# Patient Record
Sex: Female | Born: 1970 | Race: White | Hispanic: No | Marital: Married | Smoking: Current every day smoker
Health system: Southern US, Community
[De-identification: ages and names within clinical notes are randomized; demographics above are authoritative.]

## PROBLEM LIST (undated history)

## (undated) DIAGNOSIS — F411 Generalized anxiety disorder: Secondary | ICD-10-CM

## (undated) DIAGNOSIS — R112 Nausea with vomiting, unspecified: Secondary | ICD-10-CM

## (undated) DIAGNOSIS — R87619 Unspecified abnormal cytological findings in specimens from cervix uteri: Secondary | ICD-10-CM

## (undated) DIAGNOSIS — N809 Endometriosis, unspecified: Secondary | ICD-10-CM

## (undated) DIAGNOSIS — Z9889 Other specified postprocedural states: Secondary | ICD-10-CM

## (undated) DIAGNOSIS — Z800 Family history of malignant neoplasm of digestive organs: Secondary | ICD-10-CM

## (undated) HISTORY — PX: FRACTURE SURGERY: SHX138

## (undated) HISTORY — DX: Endometriosis, unspecified: N80.9

## (undated) HISTORY — DX: Unspecified abnormal cytological findings in specimens from cervix uteri: R87.619

## (undated) HISTORY — DX: Family history of malignant neoplasm of digestive organs: Z80.0

## (undated) HISTORY — PX: HEMICOLECTOMY: SHX854

## (undated) HISTORY — PX: ABDOMINAL HYSTERECTOMY: SHX81

## (undated) HISTORY — PX: ROBOTIC ASSISTED LAPAROSCOPIC HYSTERECTOMY AND SALPINGECTOMY: SHX6379

---

## 2000-02-10 DIAGNOSIS — C801 Malignant (primary) neoplasm, unspecified: Secondary | ICD-10-CM

## 2000-02-10 HISTORY — DX: Malignant (primary) neoplasm, unspecified: C80.1

## 2007-02-10 HISTORY — PX: AUGMENTATION MAMMAPLASTY: SUR837

## 2012-10-11 LAB — HM PAP SMEAR: HM Pap smear: NORMAL

## 2013-10-26 ENCOUNTER — Ambulatory Visit: Payer: Self-pay | Admitting: Gastroenterology

## 2013-11-07 ENCOUNTER — Ambulatory Visit: Payer: Self-pay | Admitting: Gastroenterology

## 2013-11-14 ENCOUNTER — Ambulatory Visit: Payer: Self-pay | Admitting: Urgent Care

## 2013-11-30 ENCOUNTER — Ambulatory Visit: Payer: Self-pay | Admitting: Surgery

## 2013-11-30 LAB — CBC WITH DIFFERENTIAL/PLATELET
BASOS PCT: 1.8 %
Basophil #: 0.1 10*3/uL (ref 0.0–0.1)
EOS PCT: 4.2 %
Eosinophil #: 0.3 10*3/uL (ref 0.0–0.7)
HCT: 41.5 % (ref 35.0–47.0)
HGB: 13.4 g/dL (ref 12.0–16.0)
LYMPHS ABS: 2.4 10*3/uL (ref 1.0–3.6)
Lymphocyte %: 38.8 %
MCH: 31.4 pg (ref 26.0–34.0)
MCHC: 32.2 g/dL (ref 32.0–36.0)
MCV: 98 fL (ref 80–100)
MONOS PCT: 6.8 %
Monocyte #: 0.4 x10 3/mm (ref 0.2–0.9)
Neutrophil #: 3 10*3/uL (ref 1.4–6.5)
Neutrophil %: 48.4 %
Platelet: 310 10*3/uL (ref 150–440)
RBC: 4.25 10*6/uL (ref 3.80–5.20)
RDW: 12.5 % (ref 11.5–14.5)
WBC: 6.3 10*3/uL (ref 3.6–11.0)

## 2013-11-30 LAB — BASIC METABOLIC PANEL
ANION GAP: 0 — AB (ref 7–16)
BUN: 10 mg/dL (ref 7–18)
CHLORIDE: 112 mmol/L — AB (ref 98–107)
Calcium, Total: 8.5 mg/dL (ref 8.5–10.1)
Co2: 27 mmol/L (ref 21–32)
Creatinine: 0.68 mg/dL (ref 0.60–1.30)
EGFR (Non-African Amer.): >60
Glucose: 62 mg/dL — ABNORMAL LOW (ref 65–99)
Osmolality: 275 (ref 275–301)
Potassium: 3.9 mmol/L (ref 3.5–5.1)
SODIUM: 139 mmol/L (ref 136–145)

## 2013-12-04 ENCOUNTER — Inpatient Hospital Stay: Payer: Self-pay | Admitting: Surgery

## 2013-12-05 LAB — COMPREHENSIVE METABOLIC PANEL
ALK PHOS: 42 U/L — AB
Albumin: 3 g/dL — ABNORMAL LOW (ref 3.4–5.0)
Anion Gap: 5 — ABNORMAL LOW (ref 7–16)
BILIRUBIN TOTAL: 0.5 mg/dL (ref 0.2–1.0)
BUN: 4 mg/dL — ABNORMAL LOW (ref 7–18)
CALCIUM: 7.8 mg/dL — AB (ref 8.5–10.1)
CHLORIDE: 106 mmol/L (ref 98–107)
Co2: 29 mmol/L (ref 21–32)
Creatinine: 0.67 mg/dL (ref 0.60–1.30)
GLUCOSE: 112 mg/dL — AB (ref 65–99)
Osmolality: 277 (ref 275–301)
POTASSIUM: 4.9 mmol/L (ref 3.5–5.1)
SGOT(AST): 23 U/L (ref 15–37)
SGPT (ALT): 16 U/L
SODIUM: 140 mmol/L (ref 136–145)
TOTAL PROTEIN: 5.7 g/dL — AB (ref 6.4–8.2)

## 2013-12-05 LAB — CBC WITH DIFFERENTIAL/PLATELET
BASOS PCT: 0.3 %
Basophil #: 0 10*3/uL (ref 0.0–0.1)
EOS ABS: 0 10*3/uL (ref 0.0–0.7)
Eosinophil %: 0.1 %
HCT: 36.8 % (ref 35.0–47.0)
HGB: 11.9 g/dL — AB (ref 12.0–16.0)
Lymphocyte #: 1.8 10*3/uL (ref 1.0–3.6)
Lymphocyte %: 11.6 %
MCH: 31.6 pg (ref 26.0–34.0)
MCHC: 32.2 g/dL (ref 32.0–36.0)
MCV: 98 fL (ref 80–100)
Monocyte #: 1 x10 3/mm — ABNORMAL HIGH (ref 0.2–0.9)
Monocyte %: 6.3 %
Neutrophil #: 12.6 10*3/uL — ABNORMAL HIGH (ref 1.4–6.5)
Neutrophil %: 81.7 %
Platelet: 287 10*3/uL (ref 150–440)
RBC: 3.75 10*6/uL — ABNORMAL LOW (ref 3.80–5.20)
RDW: 12.4 % (ref 11.5–14.5)
WBC: 15.4 10*3/uL — AB (ref 3.6–11.0)

## 2013-12-08 LAB — BASIC METABOLIC PANEL
ANION GAP: 9 (ref 7–16)
BUN: 5 mg/dL — ABNORMAL LOW (ref 7–18)
CO2: 32 mmol/L (ref 21–32)
CREATININE: 0.55 mg/dL — AB (ref 0.60–1.30)
Calcium, Total: 8.1 mg/dL — ABNORMAL LOW (ref 8.5–10.1)
Chloride: 98 mmol/L (ref 98–107)
EGFR (Non-African Amer.): >60
Glucose: 102 mg/dL — ABNORMAL HIGH (ref 65–99)
Osmolality: 275 (ref 275–301)
POTASSIUM: 3.3 mmol/L — AB (ref 3.5–5.1)
Sodium: 139 mmol/L (ref 136–145)

## 2013-12-08 LAB — CBC WITH DIFFERENTIAL/PLATELET
BASOS PCT: 0.6 %
Basophil #: 0.1 10*3/uL (ref 0.0–0.1)
Eosinophil #: 0.4 10*3/uL (ref 0.0–0.7)
Eosinophil %: 3 %
HCT: 41.6 % (ref 35.0–47.0)
HGB: 13.8 g/dL (ref 12.0–16.0)
LYMPHS ABS: 1.3 10*3/uL (ref 1.0–3.6)
Lymphocyte %: 10.1 %
MCH: 31.7 pg (ref 26.0–34.0)
MCHC: 33.3 g/dL (ref 32.0–36.0)
MCV: 95 fL (ref 80–100)
MONO ABS: 0.9 x10 3/mm (ref 0.2–0.9)
Monocyte %: 7 %
Neutrophil #: 9.9 10*3/uL — ABNORMAL HIGH (ref 1.4–6.5)
Neutrophil %: 79.3 %
PLATELETS: 361 10*3/uL (ref 150–440)
RBC: 4.37 10*6/uL (ref 3.80–5.20)
RDW: 12.2 % (ref 11.5–14.5)
WBC: 12.5 10*3/uL — ABNORMAL HIGH (ref 3.6–11.0)

## 2013-12-08 LAB — CLOSTRIDIUM DIFFICILE(ARMC)

## 2014-02-21 ENCOUNTER — Ambulatory Visit: Payer: Self-pay | Admitting: Obstetrics and Gynecology

## 2014-02-21 LAB — CBC
HCT: 40.8 % (ref 35.0–47.0)
HGB: 13.3 g/dL (ref 12.0–16.0)
MCH: 31.6 pg (ref 26.0–34.0)
MCHC: 32.6 g/dL (ref 32.0–36.0)
MCV: 97 fL (ref 80–100)
Platelet: 346 10*3/uL (ref 150–440)
RBC: 4.21 10*6/uL (ref 3.80–5.20)
RDW: 13.2 % (ref 11.5–14.5)
WBC: 8.4 10*3/uL (ref 3.6–11.0)

## 2014-02-21 LAB — COMPREHENSIVE METABOLIC PANEL
ALBUMIN: 3.3 g/dL — AB (ref 3.4–5.0)
ANION GAP: 5 — AB (ref 7–16)
Alkaline Phosphatase: 44 U/L — ABNORMAL LOW
BUN: 8 mg/dL (ref 7–18)
Bilirubin,Total: 0.4 mg/dL (ref 0.2–1.0)
CHLORIDE: 107 mmol/L (ref 98–107)
Calcium, Total: 8.5 mg/dL (ref 8.5–10.1)
Co2: 27 mmol/L (ref 21–32)
Creatinine: 0.6 mg/dL (ref 0.60–1.30)
EGFR (African American): >60
EGFR (Non-African Amer.): >60
Glucose: 78 mg/dL (ref 65–99)
OSMOLALITY: 275 (ref 275–301)
POTASSIUM: 4.1 mmol/L (ref 3.5–5.1)
SGOT(AST): 20 U/L (ref 15–37)
SGPT (ALT): 16 U/L
SODIUM: 139 mmol/L (ref 136–145)
TOTAL PROTEIN: 6.3 g/dL — AB (ref 6.4–8.2)

## 2014-02-27 ENCOUNTER — Ambulatory Visit: Payer: Self-pay | Admitting: Obstetrics and Gynecology

## 2014-02-28 LAB — BASIC METABOLIC PANEL
ANION GAP: 5 — AB (ref 7–16)
BUN: 4 mg/dL — AB (ref 7–18)
Calcium, Total: 7.5 mg/dL — ABNORMAL LOW (ref 8.5–10.1)
Chloride: 109 mmol/L — ABNORMAL HIGH (ref 98–107)
Co2: 25 mmol/L (ref 21–32)
Creatinine: 0.66 mg/dL (ref 0.60–1.30)
GLUCOSE: 102 mg/dL — AB (ref 65–99)
OSMOLALITY: 275 (ref 275–301)
Potassium: 3.6 mmol/L (ref 3.5–5.1)
Sodium: 139 mmol/L (ref 136–145)

## 2014-02-28 LAB — CBC
HCT: 33.7 % — ABNORMAL LOW (ref 35.0–47.0)
HGB: 11.2 g/dL — ABNORMAL LOW (ref 12.0–16.0)
MCH: 32.2 pg (ref 26.0–34.0)
MCHC: 33.4 g/dL (ref 32.0–36.0)
MCV: 97 fL (ref 80–100)
PLATELETS: 264 10*3/uL (ref 150–440)
RBC: 3.49 10*6/uL — ABNORMAL LOW (ref 3.80–5.20)
RDW: 13.1 % (ref 11.5–14.5)
WBC: 9.9 10*3/uL (ref 3.6–11.0)

## 2014-02-28 LAB — MAGNESIUM: Magnesium: 1.6 mg/dL — ABNORMAL LOW

## 2014-06-02 NOTE — Discharge Summary (Signed)
PATIENT NAME:  [name], [name] MR#:  [id number] DATE OF BIRTH:  1970-03-10  DATE OF ADMISSION:  12/04/2013 DATE OF DISCHARGE:  12/10/2013  DISCHARGE DIAGNOSIS: Right appendiceal/terminal ileal cecal mass consistent with diverticulosis, status post laparoscopic-assisted right hemicolectomy.   DISCHARGE MEDICATIONS ARE AS FOLLOWS:  1. Norco 1 to 2 tablets p.o. q.4 h. p.r.n. pain.  2. Zofran 4 mg p.o. q.8 h. p.r.n. nausea.   INDICATION FOR ADMISSION: [name] is a pleasant, 44 year old, who was noted to have a large external mass on her terminal ileum/appendiceal/cecal area concerning for neoplasm, including gist or carcinoid. She was thus recommended to have a surgical resection of this mass for pathology and possible therapeutic reasons.   HOSPITAL COURSE AS FOLLOWS: [name] underwent unremarkable surgery. Throughout the hospital course, she was advanced from clear liquids to a regular diet. She was also advanced from IV to p.o. pain medications at the time of discharge, taking good p.o. with good p.o. pain control. Was voiding and stooling without difficulty.   DISCHARGE INSTRUCTIONS AS FOLLOWS: [name] is to call or return to the ED if she has increased pain, nausea, vomiting, redness or drainage from her incision. She is to follow up in approximately 1 week with [facility] for follow-up care.    ____________________________ Glena Norfolk. Evaline Waltman, MD cal:JT D: 12/20/2013 08:44:44 ET T: 12/20/2013 09:26:06 ET JOB#: [id number]  cc: Harrell Gave A. Avonda Toso, MD, <Dictator> Floyde Parkins MD ELECTRONICALLY SIGNED 12/24/2013 19:32

## 2014-06-02 NOTE — Op Note (Signed)
PATIENT NAME:  [name], [name] MR#:  [id number] DATE OF BIRTH:  12/08/1970  DATE OF PROCEDURE:  12/04/2013   ATTENDING PHYSICIAN:  Louis Matte, MD DATE OF BIRTH: [name]. Aubrionna Istre, MD   PREOPERATIVE DIAGNOSIS: Cecal mass.   POSTOPERATIVE DIAGNOSIS:  Cecal mass.  PROCEDURE PERFORMED: Laparoscopic right hemicolectomy.   ANESTHESIA: General.   ESTIMATED BLOOD LOSS: 25 mL.   COMPLICATIONS: None.   SPECIMEN: Right colon.   INDICATION FOR SURGERY: [name] is a pleasant 44 year old who was noted to have a pericecal mass on CT scan. She was brought to the operating room for concern for malignancy.   DETAILS OF PROCEDURE: Informed consent was obtained. [name] was brought to the operating room suite. She was induced, endotracheal tube was placed, general anesthesia was administered. Her abdomen was prepped and draped in standard surgical fashion. A timeout was then performed correctly identifying the patient name, operative site, and procedure to be performed. An infraumbilical incision was made, it was deepened down to the fascia. The fascia was incised. The peritoneum was entered. Two stay sutures were placed at the fasciotomy. A Hassan trocar was placed in the abdomen. The abdomen was insufflated. Left lower quadrant and suprapubic 5 mm trocars were placed. The appendix was entered. The cecum was then grabbed.  There appeared to be a mass at potentially the appendiceal orifice. It just looked unusual. I then used the Harmonic scalpel to mobilize the right colon to the mid transverse colon. After I was satisfied with mobilization, a small midline incision was made. This was deepened down to the fascia. The fascia was incised.  The abdomen was entered. The specimen was then brought onto the field. There was a significant nodule that could be palpated in the appendix as well as multiple satellite nodules; 1 on the mesentery of the small bowel which upstream from the cecum. I chose the point  approximately 5 cm away from all gross disease proximally and to the right of the middle colic and then transected the bowel using a GIA 75 stapler. A combination of Harmonic scalpel and suture ligatures was used to high ligate the mesentery to this piece of bowel.  The specimen was then sent to pathology.  I then performed anastomosis between the terminal ileum and the transverse colon using a side-to-side functional end-to-end anastomotic strategy.  An enterotomy and colotomy were made.  A GIA 75 was used to make a common channel and then the colotomy and enterotomy were closed with a TA 60 stapler. I then irrigated the abdomen, examined for hemostasis. Hemostasis was obtained. I then returned to the bowel into the abdomen and closed the fascia with a running looped #1 PDS. Staples were then used to complete the dressing. The patient was then awoken, extubated and brought to the postanesthesia care unit. There were no immediate complications. Needle, sponge, and instrument counts were correct at the end of the procedure.    ____________________________ [name]. Miraj Truss, MD cal:jp D: 12/06/2013 07:30:08 ET T: 12/06/2013 08:39:52 ET JOB#: [id number]  cc: Harrell Gave A. Aunesty Tyson, MD, <Dictator> Floyde Parkins MD ELECTRONICALLY SIGNED 12/12/2013 7:06

## 2014-06-04 LAB — SURGICAL PATHOLOGY
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
## 2014-08-16 ENCOUNTER — Emergency Department
Admission: EM | Admit: 2014-08-16 | Discharge: 2014-08-16 | Disposition: A | Discharge status: Home / Self Care | Payer: TRICARE For Life (TFL) | Attending: Emergency Medicine | Admitting: Emergency Medicine

## 2014-08-16 ENCOUNTER — Encounter: Payer: Self-pay | Admitting: Medical Oncology

## 2014-08-16 DIAGNOSIS — R109 Unspecified abdominal pain: Secondary | ICD-10-CM | POA: Diagnosis present

## 2014-08-16 DIAGNOSIS — R300 Dysuria: Secondary | ICD-10-CM

## 2014-08-16 DIAGNOSIS — N390 Urinary tract infection, site not specified: Secondary | ICD-10-CM | POA: Diagnosis not present

## 2014-08-16 DIAGNOSIS — Z720 Tobacco use: Secondary | ICD-10-CM | POA: Diagnosis not present

## 2014-08-16 LAB — COMPREHENSIVE METABOLIC PANEL
ALT: 11 U/L — ABNORMAL LOW (ref 14–54)
AST: 15 U/L (ref 15–41)
Albumin: 3.9 g/dL (ref 3.5–5.0)
Alkaline Phosphatase: 56 U/L (ref 38–126)
Anion gap: 6 (ref 5–15)
BILIRUBIN TOTAL: 0.2 mg/dL — AB (ref 0.3–1.2)
BUN: 8 mg/dL (ref 6–20)
CALCIUM: 8.8 mg/dL — AB (ref 8.9–10.3)
CO2: 26 mmol/L (ref 22–32)
Chloride: 106 mmol/L (ref 101–111)
Creatinine, Ser: 0.77 mg/dL (ref 0.44–1.00)
GFR calc Af Amer: >60 mL/min (ref >60)
GFR calc non Af Amer: >60 mL/min (ref >60)
GLUCOSE: 100 mg/dL — AB (ref 65–99)
Potassium: 4.4 mmol/L (ref 3.5–5.1)
SODIUM: 138 mmol/L (ref 135–145)
TOTAL PROTEIN: 6.7 g/dL (ref 6.5–8.1)

## 2014-08-16 LAB — URINALYSIS COMPLETE WITH MICROSCOPIC (ARMC ONLY)
BACTERIA UA: NONE SEEN
Bilirubin Urine: NEGATIVE
Glucose, UA: NEGATIVE mg/dL
KETONES UR: NEGATIVE mg/dL
Nitrite: NEGATIVE
Protein, ur: 100 mg/dL — AB
SPECIFIC GRAVITY, URINE: 1.019 (ref 1.005–1.030)
Trans Epithel, UA: 1
pH: 5 (ref 5.0–8.0)

## 2014-08-16 LAB — CBC WITH DIFFERENTIAL/PLATELET
Basophils Absolute: 0 10*3/uL (ref 0–0.1)
Basophils Relative: 1 %
Eosinophils Absolute: 0.3 10*3/uL (ref 0–0.7)
Eosinophils Relative: 4 %
HCT: 40.3 % (ref 35.0–47.0)
Hemoglobin: 13.6 g/dL (ref 12.0–16.0)
LYMPHS ABS: 2.1 10*3/uL (ref 1.0–3.6)
Lymphocytes Relative: 23 %
MCH: 31.5 pg (ref 26.0–34.0)
MCHC: 33.7 g/dL (ref 32.0–36.0)
MCV: 93.7 fL (ref 80.0–100.0)
Monocytes Absolute: 0.6 10*3/uL (ref 0.2–0.9)
Monocytes Relative: 7 %
NEUTROS PCT: 65 %
Neutro Abs: 6 10*3/uL (ref 1.4–6.5)
PLATELETS: 295 10*3/uL (ref 150–440)
RBC: 4.3 MIL/uL (ref 3.80–5.20)
RDW: 12.7 % (ref 11.5–14.5)
WBC: 9 10*3/uL (ref 3.6–11.0)

## 2014-08-16 MED ORDER — CIPROFLOXACIN HCL 500 MG PO TABS: 500.0000 mg | ORAL_TABLET | Freq: Once | ORAL | Status: DC

## 2014-08-16 MED ORDER — CIPROFLOXACIN HCL 500 MG PO TABS
ORAL_TABLET | ORAL | Status: AC
  Administered 2014-08-16: 500 mg via ORAL
  Filled 2014-08-16: qty 1

## 2014-08-16 MED ORDER — CIPROFLOXACIN HCL 500 MG PO TABS
500.0000 mg | ORAL_TABLET | Freq: Once | ORAL | Status: AC
  Administered 2014-08-16: 500 mg via ORAL

## 2014-08-16 NOTE — Discharge Instructions (Signed)
Your urine showed red blood cells and white blood cells too numerous to count and leukocyte esterase present.  Take Cipro for this urinary tract infection. Follow-up with your primary doctor for further concerns and ongoing care.  Turn to the emergency department if you have fever, increasing pain, or other urgent concerns.  Urinary Tract Infection Urinary tract infections (UTIs) can develop anywhere along your urinary tract. Your urinary tract is your body's drainage system for removing wastes and extra water. Your urinary tract includes two kidneys, two ureters, a bladder, and a urethra. Your kidneys are a pair of bean-shaped organs. Each kidney is about the size of your fist. They are located below your ribs, one on each side of your spine. CAUSES Infections are caused by microbes, which are microscopic organisms, including fungi, viruses, and bacteria. These organisms are so small that they can only be seen through a microscope. Bacteria are the microbes that most commonly cause UTIs. SYMPTOMS  Symptoms of UTIs may vary by age and gender of the patient and by the location of the infection. Symptoms in young women typically include a frequent and intense urge to urinate and a painful, burning feeling in the bladder or urethra during urination. Older women and men are more likely to be tired, shaky, and weak and have muscle aches and abdominal pain. A fever may mean the infection is in your kidneys. Other symptoms of a kidney infection include pain in your back or sides below the ribs, nausea, and vomiting. DIAGNOSIS To diagnose a UTI, your caregiver will ask you about your symptoms. Your caregiver also will ask to provide a urine sample. The urine sample will be tested for bacteria and white blood cells. White blood cells are made by your body to help fight infection. TREATMENT  Typically, UTIs can be treated with medication. Because most UTIs are caused by a bacterial infection, they usually can  be treated with the use of antibiotics. The choice of antibiotic and length of treatment depend on your symptoms and the type of bacteria causing your infection. HOME CARE INSTRUCTIONS  If you were prescribed antibiotics, take them exactly as your caregiver instructs you. Finish the medication even if you feel better after you have only taken some of the medication.  Drink enough water and fluids to keep your urine clear or pale yellow.  Avoid caffeine, tea, and carbonated beverages. They tend to irritate your bladder.  Empty your bladder often. Avoid holding urine for long periods of time.  Empty your bladder before and after sexual intercourse.  After a bowel movement, women should cleanse from front to back. Use each tissue only once. SEEK MEDICAL CARE IF:   You have back pain.  You develop a fever.  Your symptoms do not begin to resolve within 3 days. SEEK IMMEDIATE MEDICAL CARE IF:   You have severe back pain or lower abdominal pain.  You develop chills.  You have nausea or vomiting.  You have continued burning or discomfort with urination. MAKE SURE YOU:   Understand these instructions.  Will watch your condition.  Will get help right away if you are not doing well or get worse. Document Released: 11/05/2004 Document Revised: 07/28/2011 Document Reviewed: 03/06/2011 [facility] Patient Information 2015 [place], [place]. This information is not intended to replace advice given to you by your health care provider. Make sure you discuss any questions you have with your health care provider.

## 2014-08-16 NOTE — ED Notes (Signed)
Pt informed to return if any life threatening symptoms occur.  

## 2014-08-16 NOTE — ED Provider Notes (Signed)
[facility] Emergency Department Provider Note  ____________________________________________  Time seen: 1810  I have reviewed the triage vital signs and the nursing notes.   HISTORY  Chief Complaint Abdominal Pain Dysuria Diarrhea    HPI [name] is a 44 y.o. female who began have diarrhea this past Monday. The diarrhea continues but is improving. She is here primarily because she has dysuria. She has a sense of urgency and burning. She is feeling like she needs to go. Return to 15 minutes. She started taking Azo 2 days ago. This did not help. She saw her primary care doctor yesterday and he obtained a urine sample for culture. Due to the fact that she was taking Azo, they did not do a dip in the office. He did not prescribe antibiotics for her because she has a complicated gastrointestinal history with a hemicolectomy earlier this year. The patient is not complaining of any belly pain. As mentioned above, her diarrhea is improving.   History reviewed. No pertinent past medical history.  There are no active problems to display for this patient.   Past Surgical History  Procedure Laterality Date  . Abdominal hysterectomy    . Hemicolectomy    . Fracture surgery      Current Outpatient Rx  Name  Route  Sig  Dispense  Refill  . ciprofloxacin (CIPRO) 500 MG tablet   Oral   Take 1 tablet (500 mg total) by mouth once.   14 tablet   0     Allergies Review of patient's allergies indicates no known allergies.  No family history on file.  Social History History  Substance Use Topics  . Smoking status: Current Every Day Smoker -- 1.00 packs/day  . Smokeless tobacco: Not on file  . Alcohol Use: Yes     Comment: weekends    Review of Systems  Constitutional: Negative for fever. ENT: Negative for sore throat. Cardiovascular: Negative for chest pain. Respiratory: Negative for shortness of breath. Gastrointestinal: Positive for diarrhea. See  history of present illness. Genitourinary: Positive for dysuria, urgency. See history of present illness Musculoskeletal: No myalgias or injuries. Skin: Negative for rash. Neurological: Negative for headaches   10-point ROS otherwise negative.  ____________________________________________   PHYSICAL EXAM:  VITAL SIGNS: ED Triage Vitals  Enc Vitals Group     BP 08/16/14 1710 126/72 mmHg     Pulse Rate 08/16/14 1710 96     Resp 08/16/14 1710 18     Temp 08/16/14 1710 98.2 F (36.8 C)     Temp Source 08/16/14 1710 Oral     SpO2 08/16/14 1710 99 %     Weight 08/16/14 1710 132 lb (59.875 kg)     Height 08/16/14 1710 5\' 4"  (1.626 m)     Head Cir --      Peak Flow --      Pain Score 08/16/14 1711 10     Pain Loc --      Pain Edu? --      Excl. in [place]? --     Constitutional: Alert and oriented. Well appearing and in no distress. ENT   Head: Normocephalic and atraumatic.   Nose: No congestion/rhinnorhea. Cardiovascular: Normal rate, regular rhythm, no murmur noted Respiratory:  Normal respiratory effort, no tachypnea.    Breath sounds are clear and equal bilaterally.  Gastrointestinal: Soft and nontender. No distention.  Back: No muscle spasm, no tenderness, no CVA tenderness. Musculoskeletal: No deformity noted. Nontender with normal range of motion  in all extremities.  No noted edema. Psychiatric: Mood and affect are normal. Speech and behavior are normal.  ____________________________________________    LABS (pertinent positives/negatives)  Labs Reviewed  COMPREHENSIVE METABOLIC PANEL - Abnormal; Notable for the following:    Glucose, Bld 100 (*)    Calcium 8.8 (*)    ALT 11 (*)    Total Bilirubin 0.2 (*)    All other components within normal limits  URINALYSIS COMPLETEWITH MICROSCOPIC ([facility] ONLY) - Abnormal; Notable for the following:    Color, Urine YELLOW (*)    APPearance CLOUDY (*)    Hgb urine dipstick 3+ (*)    Protein, ur 100 (*)    Leukocytes, UA  2+ (*)    Squamous Epithelial / LPF 0-5 (*)    All other components within normal limits  CBC WITH DIFFERENTIAL/PLATELET     ____________________________________________   EKG    ____________________________________________    RADIOLOGY    ____________________________________________   PROCEDURES    ____________________________________________   INITIAL IMPRESSION / ASSESSMENT AND PLAN / ED COURSE  Pertinent labs & imaging results that were available during my care of the patient were reviewed by me and considered in my medical decision making (see chart for details).   44 year old female in no acute distress but with complaint of dysuria and urinary urgency. Her urinalysis today shows red blood cells too numerous to count and white blood cells too numerous to count with 2+ leukocyte esterase. Her serum white blood cell count is normal at 9. We will prescribe ciprofloxacin for this urinary tract infection in part due to the double coverage that may be obtained with her diarrhea.  The patient has a culture pending from her primary physician. I will ask her to follow-up with her primary physician for ongoing care.  ____________________________________________   FINAL CLINICAL IMPRESSION(S) / ED DIAGNOSES  Final diagnoses:  Dysuria  UTI (lower urinary tract infection)      Ahmed Prima, MD 08/16/14 [phone]

## 2014-08-16 NOTE — ED Notes (Signed)
Pt ambulatory to triage with reports that she has been having lower abd pressure with frequent urination since Monday, also reports diarrhea.

## 2015-04-19 ENCOUNTER — Other Ambulatory Visit: Payer: Self-pay | Admitting: Obstetrics and Gynecology

## 2015-04-19 ENCOUNTER — Inpatient Hospital Stay
Admission: RE | Admit: 2015-04-19 | Discharge: 2015-04-19 | Disposition: A | Discharge status: Home / Self Care | Payer: Self-pay | Source: Ambulatory Visit | Attending: *Deleted | Admitting: *Deleted

## 2015-04-19 ENCOUNTER — Other Ambulatory Visit: Payer: Self-pay | Admitting: *Deleted

## 2015-04-19 DIAGNOSIS — N63 Unspecified lump in unspecified breast: Secondary | ICD-10-CM

## 2015-04-19 DIAGNOSIS — Z9289 Personal history of other medical treatment: Secondary | ICD-10-CM

## 2015-04-22 ENCOUNTER — Ambulatory Visit
Admission: RE | Admit: 2015-04-22 | Discharge: 2015-04-22 | Disposition: A | Discharge status: Home / Self Care | Source: Ambulatory Visit | Attending: Obstetrics and Gynecology | Admitting: Obstetrics and Gynecology

## 2015-04-22 DIAGNOSIS — N63 Unspecified lump in unspecified breast: Secondary | ICD-10-CM

## 2015-05-06 ENCOUNTER — Other Ambulatory Visit: Payer: TRICARE For Life (TFL)

## 2015-05-06 ENCOUNTER — Ambulatory Visit: Payer: TRICARE For Life (TFL)

## 2016-01-21 IMAGING — CT CT ABD-PELV W/ CM
2 of 5 series · 16 of 46 positions shown, 18 images · IV contrast (isovue)
Comparison: Colonoscopy report of 10/26/2013.

CLINICAL DATA: Followup of abnormal colonoscopy. Family history of
colon cancer. Colonoscopy demonstrating possible inverted appendix
versus extrinsic compression. Status post biopsies.

EXAM:
CT ABDOMEN AND PELVIS WITH CONTRAST
TECHNIQUE: Multidetector CT imaging of the abdomen and pelvis was performed
using the standard protocol following bolus administration of
intravenous contrast.
CONTRAST:  100 cc Isovue 370

[Series 2: routine with · axial · 0.66mm/px · z∈[-1141,-766]mm · 13 of 87 slices shown, 15 images]
[im 6/87  soft-tissue]
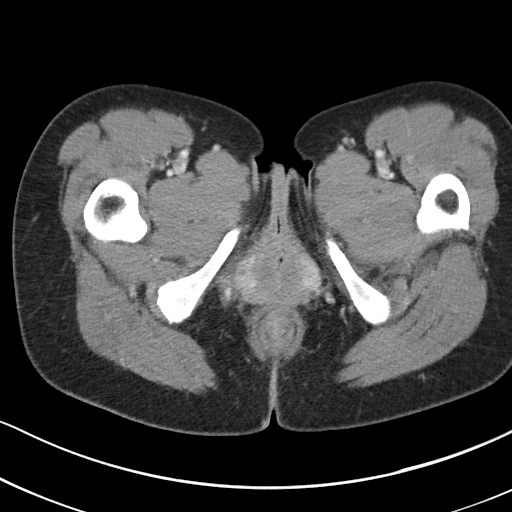
[im 6/87  bone]
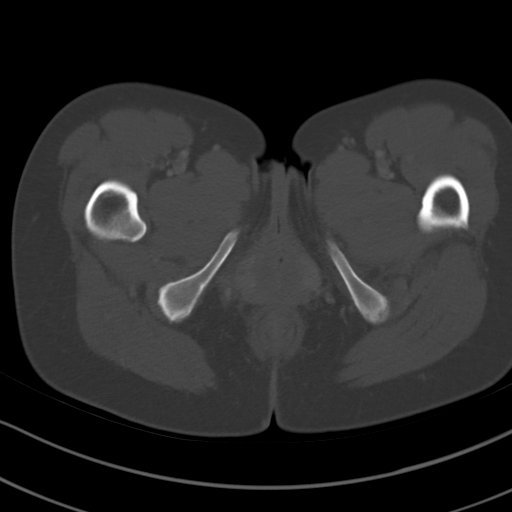
[im 11/87  soft-tissue]
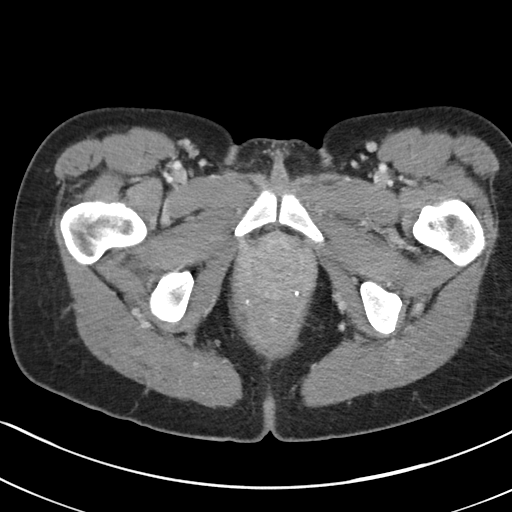
[im 21/87  soft-tissue]
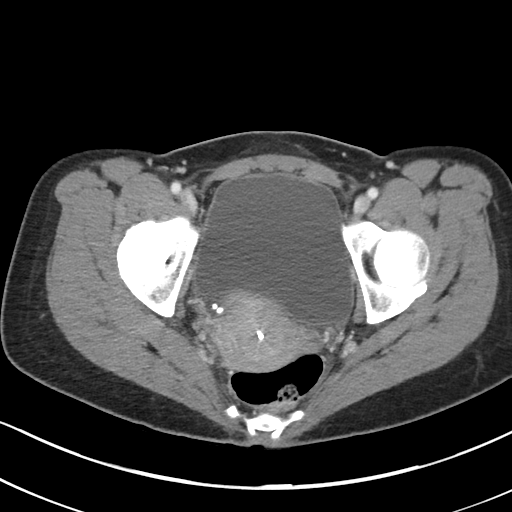
[im 26/87  soft-tissue]
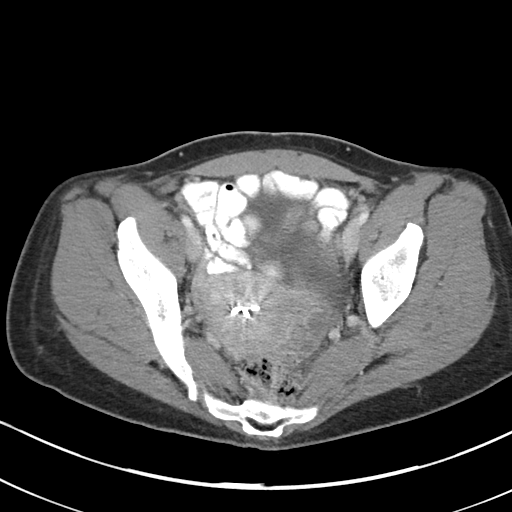
[im 31/87  soft-tissue]
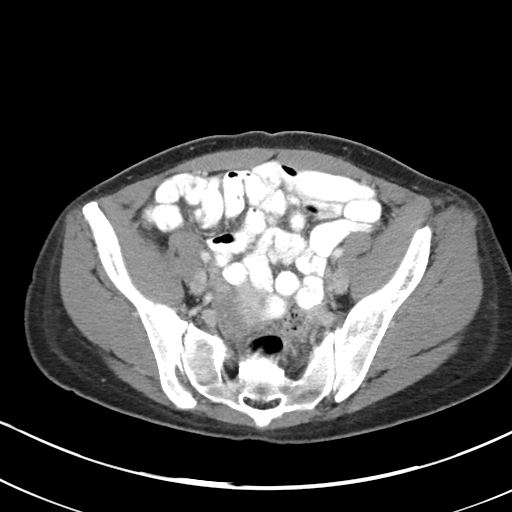
[im 36/87  soft-tissue]
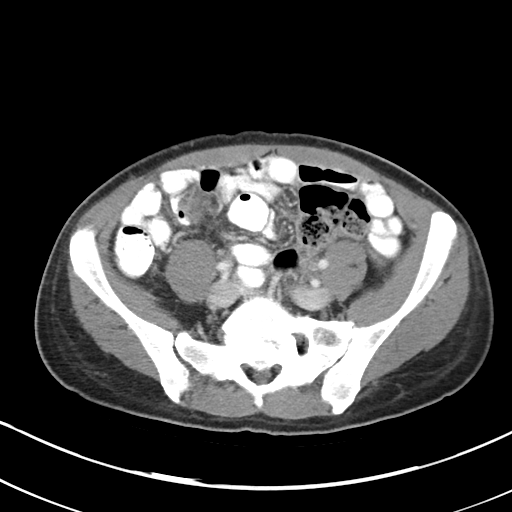
[im 46/87  soft-tissue]
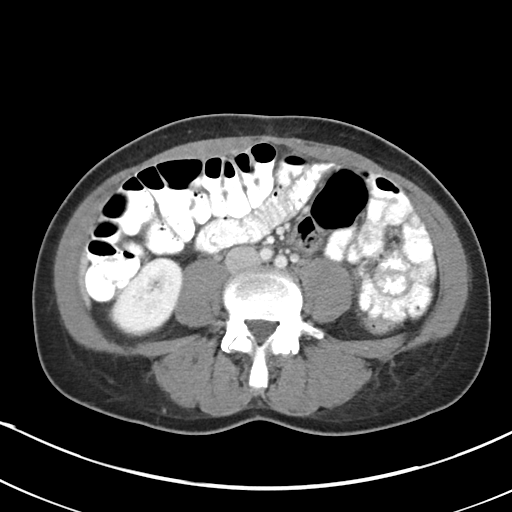
[im 51/87  soft-tissue]
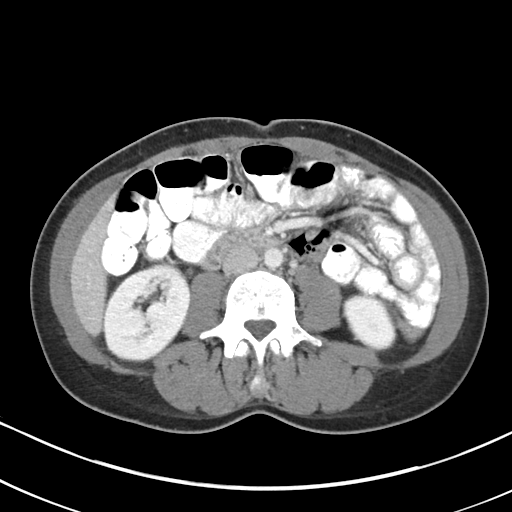
[im 56/87  soft-tissue]
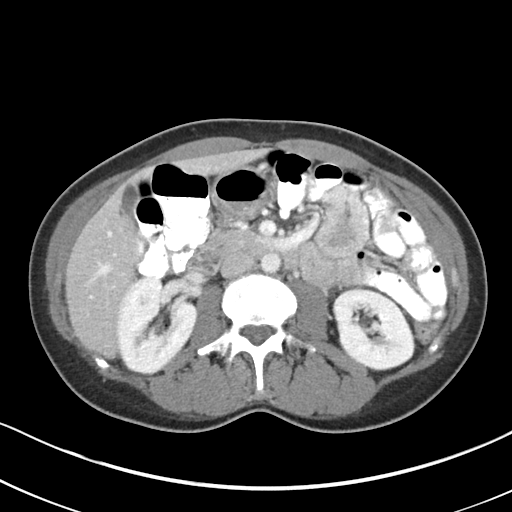
[im 56/87  bone]
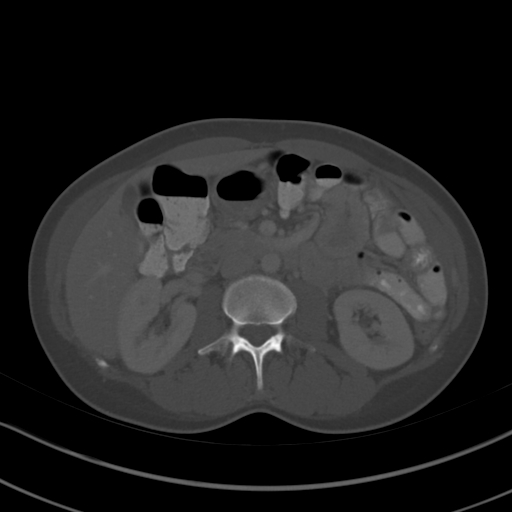
[im 61/87  soft-tissue]
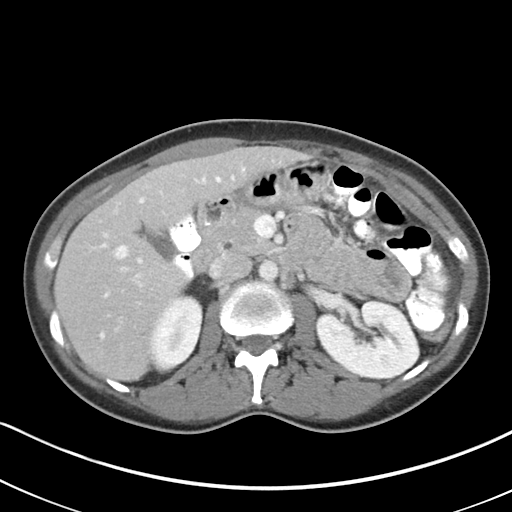
[im 66/87  soft-tissue]
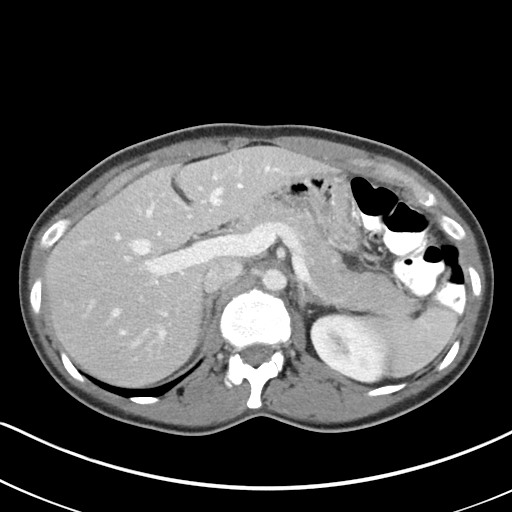
[im 76/87  soft-tissue]
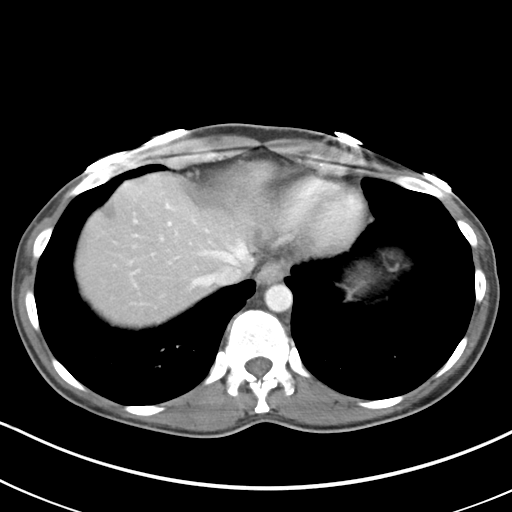
[im 81/87  soft-tissue]
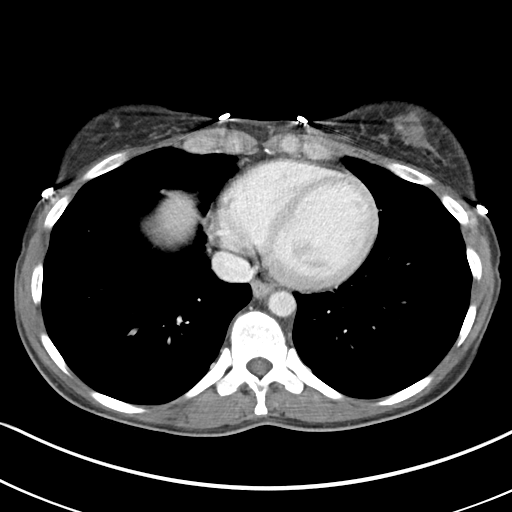

[Series 6: cor routine with · coronal · 0.77mm/px · 3 of 122 slices shown]
[im 41/122  soft-tissue]
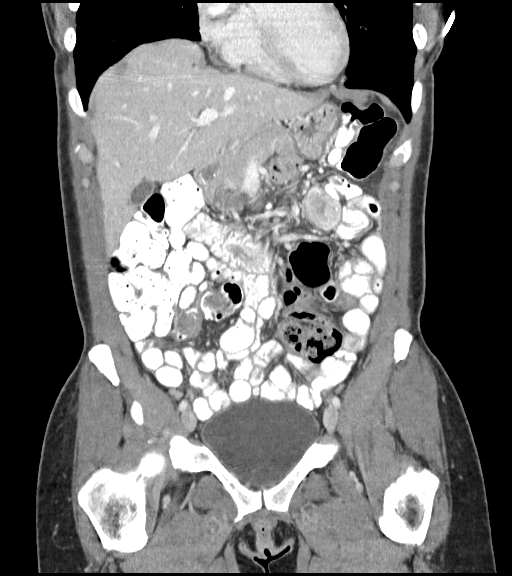
[im 54/122  soft-tissue]
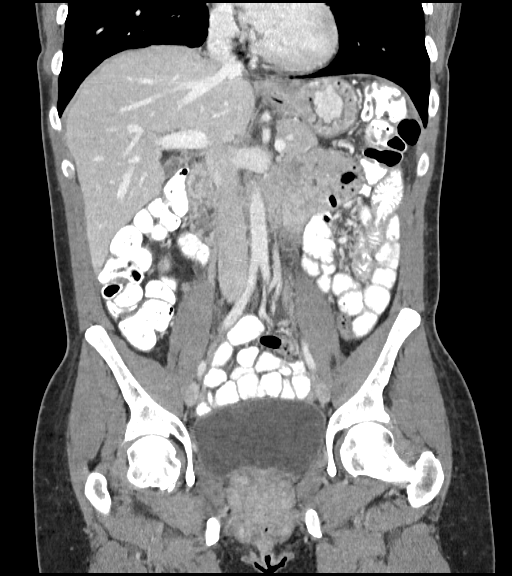
[im 68/122  soft-tissue]
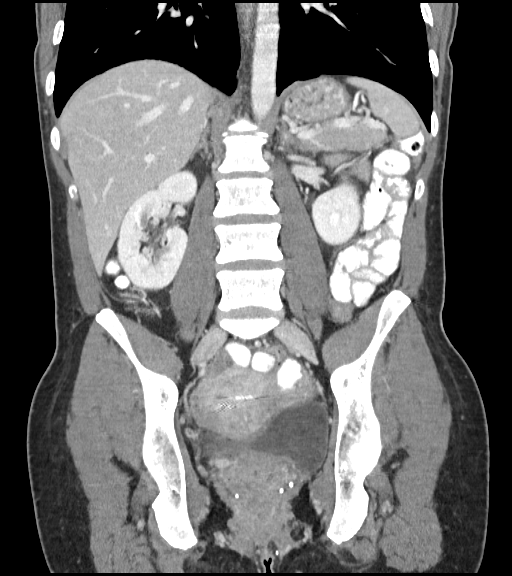

[16 of 46 positions shown; findings below may reference images not displayed]

FINDINGS: Lower chest: 3 mm left lower lobe lung nodule on image 10.

Mild scarring in the left lower lobe laterally. 2 mm low subpleural
left lower lobe lung nodule on image 7. Breast implants bilaterally.
Normal heart size without pericardial or pleural effusion.

Hepatobiliary: Scattered too small to characterize lesions within
the liver, likely cysts. Normal gallbladder, without biliary ductal
dilatation.

Pancreas: Normal, without mass or pancreatic ductal dilatation.

Spleen: Normal

Adrenals/Urinary Tract: Normal adrenal glands. Normal kidneys,
without hydronephrosis or hydroureter. Normal bladder.

Stomach/Bowel: Normal stomach, without wall thickening. Collapsed
sigmoid colon. The terminal ileum is felt to enter the cecum on
image 47 of series 2. There is/are either 1 bilobed or 2 adjacent
soft tissue lesions which are positioned along the periphery of the
cecum and adjacent terminal ileum. For example 2.7 cm on image 51 of
series 2 and 2.4 cm on image 47 of series 2. Also combined 4.9 cm on
image 39 of series 6. This may be associated with the origin of the
appendix, especially when compared to coronal image 43. No small
bowel obstruction.

Vascular/Lymphatic: No abdominal aneurysm. No retroperitoneal or
retrocrural adenopathy. No pelvic sidewall adenopathy.

Reproductive: IUD in place.  No adnexal mass.

Other:  No significant free fluid.

Musculoskeletal: Transitional S1 vertebral body.
IMPRESSION: 1. Soft tissue mass or masses centered about the cecal tip and
ileocecal valve. It is difficult to delineate if these are arise
from the gastrointestinal tract wall or extraluminally. Differential
considerations Include carcinoma or carcinomas within the
cecum/appendix, GI stromal tumor or tumors, or an usual presentation
of endometriosis. Correlation with the recently performed biopsy is
recommended. If this was not definitive, repeat biopsy versus
laparotomy should be considered. These results will be called to the
ordering clinician or representative by the Radiologist Assistant,
and communication documented in the PACS or zVision Dashboard.
2. Tiny left base lung nodules, indeterminate. Recommend attention
on follow-up.

## 2016-11-05 ENCOUNTER — Encounter: Payer: Self-pay | Admitting: Obstetrics and Gynecology

## 2016-11-05 ENCOUNTER — Ambulatory Visit (INDEPENDENT_AMBULATORY_CARE_PROVIDER_SITE_OTHER): Admitting: Obstetrics and Gynecology

## 2016-11-05 VITALS — BP 124/78 | Ht 64.0 in | Wt 134.0 lb

## 2016-11-05 DIAGNOSIS — Z1339 Encounter for screening examination for other mental health and behavioral disorders: Secondary | ICD-10-CM

## 2016-11-05 DIAGNOSIS — Z01419 Encounter for gynecological examination (general) (routine) without abnormal findings: Secondary | ICD-10-CM

## 2016-11-05 DIAGNOSIS — Z1331 Encounter for screening for depression: Secondary | ICD-10-CM

## 2016-11-05 DIAGNOSIS — Z1389 Encounter for screening for other disorder: Secondary | ICD-10-CM

## 2016-11-05 NOTE — Progress Notes (Signed)
Gynecology Annual Exam   PCP: Will Bonnet, MD  Chief Complaint  Patient presents with  . Annual Exam    History of Present Illness:  [name] is a 46 y.o. G3P1011 who LMP was No LMP recorded. Patient has had a hysterectomy., presents today for her annual examination.  Her menses are absent since her hysterectomy.  She is sexually active. No problems.  Last Pap: 2014  Results were: no abnormalities /neg HPV DNA negative Hx of STDs: none  Last mammogram: 1.5 years ago, BIrads 2 There is no FH of breast cancer. There is no FH of ovarian cancer. The patient does not do self-breast exams.  Tobacco use: smokes 1 pack per day Alcohol use: social drinker Exercise: moderately active  The patient wears seatbelts: yes.      Denies any endometriosis related pain  Past Medical History:  Diagnosis Date  . Abnormal Pap smear of cervix   . Endometriosis     Past Surgical History:  Procedure Laterality Date  . AUGMENTATION MAMMAPLASTY Bilateral 2009   SALINE  . FRACTURE SURGERY    . HEMICOLECTOMY    . ROBOTIC ASSISTED LAPAROSCOPIC HYSTERECTOMY AND SALPINGECTOMY      Medications: Denies   Allergies: No Known Allergies  Gynecologic History: No LMP recorded. Patient has had a hysterectomy.  Obstetric History: [id number]  Social History   Social History  . Marital status: Married    Spouse name: N/A  . Number of children: N/A  . Years of education: N/A   Occupational History  . Not on file.   Social History Main Topics  . Smoking status: Current Every Day Smoker    Packs/day: 1.00  . Smokeless tobacco: Never Used  . Alcohol use Yes     Comment: weekends  . Drug use: No  . Sexual activity: Yes    Birth control/ protection: Surgical   Other Topics Concern  . Not on file   Social History Narrative  . No narrative on file    Family History  Problem Relation Age of Onset  . Colon cancer Mother   . Diabetes Mellitus II Father   . Heart disease  Father   . Hypertension Father   . Hypertension Sister   . Colon cancer Maternal Grandmother   . Breast cancer Neg Hx     Review of Systems  Constitutional: Negative.   HENT: Negative.   Eyes: Negative.   Respiratory: Negative.   Cardiovascular: Negative.   Gastrointestinal: Negative.   Genitourinary: Negative.   Musculoskeletal: Negative.   Skin: Negative.   Neurological: Negative.   Psychiatric/Behavioral: Negative.      Physical Exam BP 124/78   Ht 5\' 4"  (1.626 m)   Wt 134 lb (60.8 kg)   BMI 23.00 kg/m    Physical Exam  Constitutional: She is oriented to person, place, and time. She appears well-developed and well-nourished. No distress.  HENT:  Head: Normocephalic and atraumatic.  Eyes: EOM are normal. No scleral icterus.  Neck: Normal range of motion. Neck supple. No thyromegaly present.  Cardiovascular: Normal rate and regular rhythm.   Pulmonary/Chest: Effort normal and breath sounds normal. No respiratory distress. She has no wheezes. She has no rales.  Abdominal: Soft. Bowel sounds are normal. She exhibits no distension and no mass. There is no tenderness. There is no rebound and no guarding.  Musculoskeletal: Normal range of motion. She exhibits no edema.  Lymphadenopathy:    She has no cervical adenopathy.  Neurological:  She is alert and oriented to person, place, and time. No cranial nerve deficit.  Skin: Skin is warm and dry. No erythema.  Psychiatric: She has a normal mood and affect. Her behavior is normal. Judgment normal.    Female chaperone present for pelvic and breast  portions of the physical exam  Results: AUDIT Questionnaire (screen for alcoholism): 4 PHQ-9: 1   Assessment: 46 y.o. G15P1011 female here for routine annual gynecologic examination.  Plan: Problem List Items Addressed This Visit    Women's annual routine gynecological examination    Other Visit Diagnoses    Screening for alcohol problem    -  Primary   Screening for  depression          Screening: -- Blood pressure screen normal -- Colonoscopy - not due -- Mammogram - due. Patient to call [facility] to arrange. She understands that it is her responsibility to arrange this. -- Weight screening: normal -- Depression screening negative (PHQ-9) -- Nutrition: normal -- cholesterol screening: will obtain (declines for now, but will return at a later date TBD). STrongly recommend this as she states her father had an MI in his early 61s.  -- osteoporosis screening: not due -- tobacco screening: using: discussed quitting using the 5 A's. Not ready to quit -- alcohol screening: AUDIT questionnaire indicates low-risk usage. -- family history of breast cancer screening: done. not at high risk. -- no evidence of domestic violence or intimate partner violence. -- STD screening: gonorrhea/chlamydia NAAT not collected per patient request. -- pap smear not collected per ASCCP guidelines -- flu vaccine offered -- HPV vaccination series: not eligilbe  Prentice Docker, MD 11/05/2016 4:14 PM

## 2017-11-08 ENCOUNTER — Other Ambulatory Visit: Payer: Self-pay

## 2017-11-08 ENCOUNTER — Encounter: Payer: Self-pay | Admitting: Obstetrics and Gynecology

## 2017-11-08 ENCOUNTER — Ambulatory Visit (INDEPENDENT_AMBULATORY_CARE_PROVIDER_SITE_OTHER): Admitting: Obstetrics and Gynecology

## 2017-11-08 VITALS — BP 114/80 | Ht 65.0 in | Wt 141.0 lb

## 2017-11-08 DIAGNOSIS — Z1239 Encounter for other screening for malignant neoplasm of breast: Secondary | ICD-10-CM

## 2017-11-08 DIAGNOSIS — Z800 Family history of malignant neoplasm of digestive organs: Secondary | ICD-10-CM

## 2017-11-08 DIAGNOSIS — Z1331 Encounter for screening for depression: Secondary | ICD-10-CM

## 2017-11-08 DIAGNOSIS — Z01411 Encounter for gynecological examination (general) (routine) with abnormal findings: Secondary | ICD-10-CM

## 2017-11-08 DIAGNOSIS — Z1211 Encounter for screening for malignant neoplasm of colon: Secondary | ICD-10-CM

## 2017-11-08 DIAGNOSIS — Z1231 Encounter for screening mammogram for malignant neoplasm of breast: Secondary | ICD-10-CM

## 2017-11-08 DIAGNOSIS — Z1339 Encounter for screening examination for other mental health and behavioral disorders: Secondary | ICD-10-CM | POA: Diagnosis not present

## 2017-11-08 DIAGNOSIS — F419 Anxiety disorder, unspecified: Secondary | ICD-10-CM

## 2017-11-08 DIAGNOSIS — Z01419 Encounter for gynecological examination (general) (routine) without abnormal findings: Secondary | ICD-10-CM

## 2017-11-08 NOTE — Progress Notes (Signed)
Gynecology Annual Exam  PCP: Will Bonnet, MD  Chief Complaint  Patient presents with  . Annual Exam    History of Present Illness:  [name] is a 47 y.o. G3P1011 who LMP was No LMP recorded. Patient has had a hysterectomy., presents today for her annual examination.  Her menses are absent.   She does have vasomotor symptoms. She has mood symptoms and wonders if she is going through menopause.  She has a high degree of anxiety. She also has breast tenderness, bilaterally.  The breast tenderness comes about monthly and lasts about a week.  She notes her anxiety mostly when she is driving. Her anxiety has been present for about 9 months and she also began driving around the state at that time. [place] makes her anxiety the worst and this coincides with her change in job roles.    She is single partner, contraception - status post hysterectomy. She does have vaginal dryness.  Last Pap: 2014  Results were: no abnormalities /neg HPV DNA.  Hx of STDs: none  Last mammogram: 2 years  Results were: BiRads 2 There is no FH of breast cancer. There is no FH of ovarian cancer. The patient does do self-breast exams.  Colonoscopy: 3 years ago DEXA: has not been screened for osteoporosis  Tobacco use: she smokes 1/2-1ppd.  She is not ready to quit.. Alcohol use: glass of wine with dinner Exercise: not active  The patient wears seatbelts: yes.     Past Medical History:  Diagnosis Date  . Abnormal Pap smear of cervix   . Endometriosis     Past Surgical History:  Procedure Laterality Date  . AUGMENTATION MAMMAPLASTY Bilateral 2009   SALINE  . FRACTURE SURGERY    . HEMICOLECTOMY    . ROBOTIC ASSISTED LAPAROSCOPIC HYSTERECTOMY AND SALPINGECTOMY      Prior to Admission medications   Denies   Allergies: No Known Allergies  Obstetric History: G3P1011  Family History  Problem Relation Age of Onset  . Colon cancer Mother   . Diabetes Mellitus II Father   .  Heart disease Father   . Hypertension Father   . Hypertension Sister   . Colon cancer Maternal Grandmother   . Breast cancer Neg Hx     Social History   Socioeconomic History  . Marital status: Married    Spouse name: Not on file  . Number of children: Not on file  . Years of education: Not on file  . Highest education level: Not on file  Occupational History  . Not on file  Social Needs  . Financial resource strain: Not on file  . Food insecurity:    Worry: Not on file    Inability: Not on file  . Transportation needs:    Medical: Not on file    Non-medical: Not on file  Tobacco Use  . Smoking status: Current Every Day Smoker    Packs/day: 1.00  . Smokeless tobacco: Never Used  Substance and Sexual Activity  . Alcohol use: Yes    Comment: weekends  . Drug use: No  . Sexual activity: Yes    Birth control/protection: Surgical  Lifestyle  . Physical activity:    Days per week: Not on file    Minutes per session: Not on file  . Stress: Not on file  Relationships  . Social connections:    Talks on phone: Not on file    Gets together: Not on file  Attends religious service: Not on file    Active member of club or organization: Not on file    Attends meetings of clubs or organizations: Not on file    Relationship status: Not on file  . Intimate partner violence:    Fear of current or ex partner: Not on file    Emotionally abused: Not on file    Physically abused: Not on file    Forced sexual activity: Not on file  Other Topics Concern  . Not on file  Social History Narrative  . Not on file    Review of Systems  Constitutional: Negative.   HENT: Negative.   Eyes: Negative.   Respiratory: Negative.   Cardiovascular: Negative.   Gastrointestinal: Negative.   Genitourinary: Negative.   Musculoskeletal: Negative.   Skin: Negative.        Breast tenderness  Neurological: Positive for headaches. Negative for dizziness, tingling, tremors, sensory change,  speech change, focal weakness, seizures, loss of consciousness and weakness.  Psychiatric/Behavioral: Negative for depression, hallucinations, memory loss, substance abuse and suicidal ideas. The patient is nervous/anxious. The patient does not have insomnia.      Physical Exam BP 114/80   Ht 5\' 5"  (1.651 m)   Wt 141 lb (64 kg)   BMI 23.46 kg/m   Physical Exam  Constitutional: She is oriented to person, place, and time. She appears well-developed and well-nourished. No distress.  Genitourinary: Vagina normal. Pelvic exam was performed with patient supine. There is no rash, tenderness, lesion or injury on the right labia. There is no rash, tenderness, lesion or injury on the left labia. Right adnexum does not display mass, does not display tenderness and does not display fullness. Left adnexum does not display mass, does not display tenderness and does not display fullness.  Genitourinary Comments: Uterus and cervix surgically absent.  No masses appreciated on bimanual  Eyes: EOM are normal. No scleral icterus.  Neck: Normal range of motion. Neck supple.  Cardiovascular: Normal rate and regular rhythm.  Pulmonary/Chest: Effort normal and breath sounds normal. No respiratory distress. She has no wheezes. She has no rales. Right breast exhibits no inverted nipple, no mass, no nipple discharge and no tenderness. Left breast exhibits no inverted nipple, no mass, no nipple discharge, no skin change and no tenderness.  Breast implants limit exam  Abdominal: Soft. Bowel sounds are normal. She exhibits no distension and no mass. There is no tenderness. There is no rebound and no guarding.  Musculoskeletal: Normal range of motion. She exhibits no edema.  Neurological: She is alert and oriented to person, place, and time. No cranial nerve deficit.  Skin: Skin is warm and dry. No erythema.  Psychiatric: She has a normal mood and affect. Her behavior is normal. Judgment normal.    Female chaperone  present for pelvic and breast  portions of the physical exam  Results: AUDIT Questionnaire (screen for alcoholism): 4 PHQ-9: 7 GAD-7: 15  Assessment: 47 y.o. G49P1011 female here for routine gynecologic examination.  Plan: Problem List Items Addressed This Visit      Other   Women's annual routine gynecological examination - Primary   Relevant Orders   Ambulatory referral to Gastroenterology   MM DIGITAL SCREENING BILATERAL   Anxiety    Other Visit Diagnoses    Screening for depression       Screening for alcoholism       Screen for colon cancer       Relevant Orders   Ambulatory referral  to Gastroenterology   Family history of colon cancer       Relevant Orders   Ambulatory referral to Gastroenterology   Breast cancer screening       Relevant Orders   MM DIGITAL SCREENING BILATERAL      Screening: -- Blood pressure screen normal -- Colonoscopy - due - will schedule -- Mammogram - due. Patient to call [facility] to arrange. She understands that it is her responsibility to arrange this. -- Weight screening: normal -- Depression screening negative (PHQ-9) -- Nutrition: normal -- cholesterol screening: not due for screening -- osteoporosis screening: not due -- tobacco screening: using: discussed quitting using the 5 A's -- alcohol screening: AUDIT questionnaire indicates low-risk usage. -- family history of breast cancer screening: done. not at high risk. -- no evidence of domestic violence or intimate partner violence. -- STD screening: gonorrhea/chlamydia NAAT not collected per patient request. -- pap smear not collected per ASCCP guidelines -- flu vaccine declines -- HPV vaccination series: not eligilbe   Anxiety: she declines treatment at this time. We discussed treatment options and she would like to consider her options.   Prentice Docker, MD 11/08/2017 9:00 AM

## 2017-11-09 ENCOUNTER — Other Ambulatory Visit: Payer: Self-pay

## 2017-11-17 ENCOUNTER — Other Ambulatory Visit: Payer: Self-pay | Admitting: Obstetrics and Gynecology

## 2017-11-17 DIAGNOSIS — F411 Generalized anxiety disorder: Secondary | ICD-10-CM

## 2017-11-17 MED ORDER — BUSPIRONE HCL 5 MG PO TABS: ORAL_TABLET | ORAL | 1 refills | Status: DC

## 2017-11-22 ENCOUNTER — Other Ambulatory Visit: Payer: Self-pay

## 2017-11-22 ENCOUNTER — Encounter: Payer: Self-pay | Admitting: *Deleted

## 2017-12-15 ENCOUNTER — Other Ambulatory Visit: Payer: Self-pay | Admitting: Obstetrics and Gynecology

## 2017-12-15 DIAGNOSIS — F411 Generalized anxiety disorder: Secondary | ICD-10-CM

## 2017-12-15 MED ORDER — BUSPIRONE HCL 10 MG PO TABS: 10.0000 mg | ORAL_TABLET | Freq: Two times a day (BID) | ORAL | 5 refills | Status: DC

## 2017-12-20 ENCOUNTER — Other Ambulatory Visit: Payer: Self-pay

## 2017-12-20 ENCOUNTER — Encounter: Payer: Self-pay | Admitting: *Deleted

## 2017-12-23 NOTE — Discharge Instructions (Signed)
General Anesthesia, Adult, Care After °These instructions provide you with information about caring for yourself after your procedure. Your health care provider may also give you more specific instructions. Your treatment has been planned according to current medical practices, but problems sometimes occur. Call your health care provider if you have any problems or questions after your procedure. °What can I expect after the procedure? °After the procedure, it is common to have: °· Vomiting. °· A sore throat. °· Mental slowness. ° °It is common to feel: °· Nauseous. °· Cold or shivery. °· Sleepy. °· Tired. °· Sore or achy, even in parts of your body where you did not have surgery. ° °Follow these instructions at home: °For at least 24 hours after the procedure: °· Do not: °? Participate in activities where you could fall or become injured. °? Drive. °? Use heavy machinery. °? Drink alcohol. °? Take sleeping pills or medicines that cause drowsiness. °? Make important decisions or sign legal documents. °? Take care of children on your own. °· Rest. °Eating and drinking °· If you vomit, drink water, juice, or soup when you can drink without vomiting. °· Drink enough fluid to keep your urine clear or pale yellow. °· Make sure you have little or no nausea before eating solid foods. °· Follow the diet recommended by your health care provider. °General instructions °· Have a responsible adult stay with you until you are awake and alert. °· Return to your normal activities as told by your health care provider. Ask your health care provider what activities are safe for you. °· Take over-the-counter and prescription medicines only as told by your health care provider. °· If you smoke, do not smoke without supervision. °· Keep all follow-up visits as told by your health care provider. This is important. °Contact a health care provider if: °· You continue to have nausea or vomiting at home, and medicines are not helpful. °· You  cannot drink fluids or start eating again. °· You cannot urinate after 8-12 hours. °· You develop a skin rash. °· You have fever. °· You have increasing redness at the site of your procedure. °Get help right away if: °· You have difficulty breathing. °· You have chest pain. °· You have unexpected bleeding. °· You feel that you are having a life-threatening or urgent problem. °This information is not intended to replace advice given to you by your health care provider. Make sure you discuss any questions you have with your health care provider. °Document Released: 05/04/2000 Document Revised: 07/01/2015 Document Reviewed: 01/10/2015 °Elsevier Interactive Patient Education © 2018 [employer] ° °

## 2017-12-24 ENCOUNTER — Ambulatory Visit: Admitting: Anesthesiology

## 2017-12-24 ENCOUNTER — Ambulatory Visit
Admission: RE | Admit: 2017-12-24 | Discharge: 2017-12-24 | Disposition: A | Discharge status: Home / Self Care | Source: Ambulatory Visit | Attending: Gastroenterology | Admitting: Gastroenterology

## 2017-12-24 ENCOUNTER — Ambulatory Visit
Admission: RE | Disposition: A | Discharge status: Home / Self Care | Payer: Self-pay | Source: Ambulatory Visit | Attending: Gastroenterology

## 2017-12-24 DIAGNOSIS — Z800 Family history of malignant neoplasm of digestive organs: Secondary | ICD-10-CM

## 2017-12-24 DIAGNOSIS — K648 Other hemorrhoids: Secondary | ICD-10-CM | POA: Diagnosis not present

## 2017-12-24 DIAGNOSIS — F17210 Nicotine dependence, cigarettes, uncomplicated: Secondary | ICD-10-CM | POA: Diagnosis not present

## 2017-12-24 DIAGNOSIS — Z79899 Other long term (current) drug therapy: Secondary | ICD-10-CM | POA: Insufficient documentation

## 2017-12-24 DIAGNOSIS — D125 Benign neoplasm of sigmoid colon: Secondary | ICD-10-CM | POA: Insufficient documentation

## 2017-12-24 DIAGNOSIS — K635 Polyp of colon: Secondary | ICD-10-CM

## 2017-12-24 HISTORY — PX: COLONOSCOPY WITH PROPOFOL: SHX5780

## 2017-12-24 HISTORY — PX: POLYPECTOMY: SHX5525

## 2017-12-24 HISTORY — DX: Other specified postprocedural states: Z98.890

## 2017-12-24 HISTORY — DX: Nausea with vomiting, unspecified: R11.2

## 2017-12-24 SURGERY — COLONOSCOPY WITH PROPOFOL
Anesthesia: General | Site: Rectum

## 2017-12-24 MED ORDER — SODIUM CHLORIDE 0.9 % IV SOLN: INTRAVENOUS | Status: DC

## 2017-12-24 MED ORDER — LACTATED RINGERS IV SOLN
10.0000 mL/h | INTRAVENOUS | Status: DC
  Administered 2017-12-24: 10 mL/h via INTRAVENOUS

## 2017-12-24 MED ORDER — PROPOFOL 10 MG/ML IV BOLUS
INTRAVENOUS | Status: DC | PRN
  Administered 2017-12-24: 100 mg via INTRAVENOUS
  Administered 2017-12-24 (×2): 20 mg via INTRAVENOUS
  Administered 2017-12-24 (×2): 30 mg via INTRAVENOUS
  Administered 2017-12-24: 50 mg via INTRAVENOUS

## 2017-12-24 MED ORDER — ONDANSETRON HCL 4 MG/2ML IJ SOLN: 4.0000 mg | Freq: Once | INTRAMUSCULAR | Status: DC | PRN

## 2017-12-24 MED ORDER — STERILE WATER FOR IRRIGATION IR SOLN
Status: DC | PRN
  Administered 2017-12-24: 08:00:00

## 2017-12-24 MED ORDER — LIDOCAINE HCL (CARDIAC) PF 100 MG/5ML IV SOSY
PREFILLED_SYRINGE | INTRAVENOUS | Status: DC | PRN
  Administered 2017-12-24: 40 mg via INTRAVENOUS

## 2017-12-24 SURGICAL SUPPLY — 6 items
CANISTER SUCT 1200ML W/VALVE (MISCELLANEOUS) ×3 IMPLANT
FORCEPS BIOP RAD 4 LRG CAP 4 (CUTTING FORCEPS) ×3 IMPLANT
GOWN CVR UNV OPN BCK APRN NK (MISCELLANEOUS) ×2 IMPLANT
GOWN ISOL THUMB LOOP REG UNIV (MISCELLANEOUS) ×4
KIT ENDO PROCEDURE OLY (KITS) ×3 IMPLANT
WATER STERILE IRR 250ML POUR (IV SOLUTION) ×3 IMPLANT

## 2017-12-24 NOTE — Op Note (Signed)
[facility] Gastroenterology Patient Name: [name] Procedure Date: 12/24/2017 7:45 AM MRN: [id number] Account #: [id number] Date of Birth: 1970/10/22 Admit Type: Outpatient Age: 47 Room: [facility] OR ROOM 01 Gender: Female Note Status: Finalized Procedure:            Colonoscopy Indications:          Family history of anal canal cancer in a first-degree                        relative Providers:            Lucilla Lame MD, MD Referring MD:         Will Bonnet (Referring MD) Medicines:            Propofol per Anesthesia Complications:        No immediate complications. Procedure:            Pre-Anesthesia Assessment:                       - Prior to the procedure, a History and Physical was                        performed, and patient medications and allergies were                        reviewed. The patient's tolerance of previous                        anesthesia was also reviewed. The risks and benefits of                        the procedure and the sedation options and risks were                        discussed with the patient. All questions were                        answered, and informed consent was obtained. Prior                        Anticoagulants: The patient has taken no previous                        anticoagulant or antiplatelet agents. ASA Grade                        Assessment: II - A patient with mild systemic disease.                        After reviewing the risks and benefits, the patient was                        deemed in satisfactory condition to undergo the                        procedure.                       After obtaining informed consent, the colonoscope was  passed under direct vision. Throughout the procedure,                        the patient's blood pressure, pulse, and oxygen                        saturations were monitored continuously. The                        Colonoscope was  introduced through the anus and                        advanced to the the ileocolonic anastomosis. The                        colonoscopy was performed without difficulty. The                        patient tolerated the procedure well. The quality of                        the bowel preparation was excellent. Findings:      The perianal and digital rectal examinations were normal.      Two sessile polyps were found in the sigmoid colon. The polyps were 2 to       3 mm in size. These polyps were removed with a cold biopsy forceps.       Resection and retrieval were complete.      Non-bleeding internal hemorrhoids were found during retroflexion. The       hemorrhoids were Grade I (internal hemorrhoids that do not prolapse).      Multiple small-mouthed diverticula were found in the descending colon. Impression:           - Two 2 to 3 mm polyps in the sigmoid colon, removed                        with a cold biopsy forceps. Resected and retrieved.                       - Non-bleeding internal hemorrhoids.                       - Diverticulosis in the descending colon. Recommendation:       - Discharge patient to home.                       - Resume previous diet.                       - Continue present medications.                       - Await pathology results.                       - Repeat colonoscopy in 5 years for surveillance. Procedure Code(s):    --- Professional ---                       [phone], Colonoscopy, flexible; with biopsy, single or  multiple Diagnosis Code(s):    --- Professional ---                       Z80.0, Family history of malignant neoplasm of                        digestive organs                       D12.5, Benign neoplasm of sigmoid colon CPT copyright 2018 American Medical Association. All rights reserved. The codes documented in this report are preliminary and upon coder review may  be revised to meet current compliance  requirements. Lucilla Lame MD, MD 12/24/2017 8:11:18 AM This report has been signed electronically. Number of Addenda: 0 Note Initiated On: 12/24/2017 7:45 AM Scope Withdrawal Time: 0 hours 6 minutes 6 seconds  Total Procedure Duration: 0 hours 12 minutes 41 seconds       [facility]

## 2017-12-24 NOTE — Anesthesia Procedure Notes (Signed)
Date/Time: 12/24/2017 7:50 AM Performed by: Janna Arch, CRNA Pre-anesthesia Checklist: Patient identified, Emergency Drugs available, Suction available, Timeout performed and Patient being monitored Patient Re-evaluated:Patient Re-evaluated prior to induction Oxygen Delivery Method: Nasal cannula Placement Confirmation: positive ETCO2

## 2017-12-24 NOTE — Transfer of Care (Signed)
Immediate Anesthesia Transfer of Care Note  Patient: [name]  Procedure(s) Performed: COLONOSCOPY WITH BIOPSIES (N/A Rectum) POLYPECTOMY (N/A Rectum)  Patient Location: PACU  Anesthesia Type: General  Level of Consciousness: awake, alert  and patient cooperative  Airway and Oxygen Therapy: Patient Spontanous Breathing and Patient connected to supplemental oxygen  Post-op Assessment: Post-op Vital signs reviewed, Patient's Cardiovascular Status Stable, Respiratory Function Stable, Patent Airway and No signs of Nausea or vomiting  Post-op Vital Signs: Reviewed and stable  Complications: No apparent anesthesia complications

## 2017-12-24 NOTE — Anesthesia Preprocedure Evaluation (Signed)
Anesthesia Evaluation  Patient identified by MRN, date of birth, ID band Patient awake    Reviewed: Allergy & Precautions, NPO status , Patient's Chart, lab work & pertinent test results  History of Anesthesia Complications (+) PONV  Airway Mallampati: II  TM Distance: >3 FB     Dental   Pulmonary Current Smoker,    breath sounds clear to auscultation       Cardiovascular negative cardio ROS   Rhythm:Regular Rate:Normal     Neuro/Psych Anxiety    GI/Hepatic negative GI ROS, Bowel prep,  Endo/Other  negative endocrine ROS  Renal/GU      Musculoskeletal   Abdominal   Peds  Hematology   Anesthesia Other Findings   Reproductive/Obstetrics                             Anesthesia Physical Anesthesia Plan  ASA: II  Anesthesia Plan: General   Post-op Pain Management:    Induction: Intravenous  PONV Risk Score and Plan:   Airway Management Planned: Natural Airway and Nasal Cannula  Additional Equipment:   Intra-op Plan:   Post-operative Plan:   Informed Consent: I have reviewed the patients History and Physical, chart, labs and discussed the procedure including the risks, benefits and alternatives for the proposed anesthesia with the patient or authorized representative who has indicated his/her understanding and acceptance.   Dental advisory given  Plan Discussed with: CRNA  Anesthesia Plan Comments:         Anesthesia Quick Evaluation

## 2017-12-24 NOTE — H&P (Signed)
Dawn Lame, MD [address] [place], [place] [address] Phone:[phone] Fax : [phone]  Primary Care Physician:  Dawn Bonnet, MD Primary Gastroenterologist:  Dr. Allen Sharp  Pre-Procedure History & Physical: HPI:  [name] is a 47 y.o. female is here for an colonoscopy.   Past Medical History:  Diagnosis Date  . Abnormal Pap smear of cervix   . Endometriosis   . PONV (postoperative nausea and vomiting)     Past Surgical History:  Procedure Laterality Date  . AUGMENTATION MAMMAPLASTY Bilateral 2009   SALINE  . FRACTURE SURGERY    . HEMICOLECTOMY    . ROBOTIC ASSISTED LAPAROSCOPIC HYSTERECTOMY AND SALPINGECTOMY      Prior to Admission medications   Medication Sig Start Date End Date Taking? Authorizing Provider  B Complex Vitamins (VITAMIN B-COMPLEX PO) Take by mouth daily.   Yes [provider]  busPIRone (BUSPAR) 10 MG tablet Take 1 tablet (10 mg total) by mouth 2 (two) times daily. 12/15/17  Yes Dawn Bonnet, MD  Cholecalciferol (VITAMIN D PO) Take by mouth daily.   Yes [provider]  Multiple Vitamin (MULTIVITAMIN) tablet Take 1 tablet by mouth daily.   Yes [provider]    Allergies as of 11/08/2017  . (No Known Allergies)    Family History  Problem Relation Age of Onset  . Colon cancer Mother   . Diabetes Mellitus II Father   . Heart disease Father   . Hypertension Father   . Hypertension Sister   . Colon cancer Maternal Grandmother   . Breast cancer Neg Hx     Social History   Socioeconomic History  . Marital status: Married    Spouse name: Not on file  . Number of children: Not on file  . Years of education: Not on file  . Highest education level: Not on file  Occupational History  . Not on file  Social Needs  . Financial resource strain: Not on file  . Food insecurity:    Worry: Not on file    Inability: Not on file  . Transportation needs:    Medical: Not on file   Non-medical: Not on file  Tobacco Use  . Smoking status: Current Every Day Smoker    Packs/day: 0.75    Years: 26.00    Pack years: 19.50  . Smokeless tobacco: Never Used  Substance and Sexual Activity  . Alcohol use: Yes    Alcohol/week: 5.0 standard drinks    Types: 5 Cans of beer per week    Comment: weekends  . Drug use: No  . Sexual activity: Yes    Birth control/protection: Surgical  Lifestyle  . Physical activity:    Days per week: Not on file    Minutes per session: Not on file  . Stress: Not on file  Relationships  . Social connections:    Talks on phone: Not on file    Gets together: Not on file    Attends religious service: Not on file    Active member of club or organization: Not on file    Attends meetings of clubs or organizations: Not on file    Relationship status: Not on file  . Intimate partner violence:    Fear of current or ex partner: Not on file    Emotionally abused: Not on file    Physically abused: Not on file    Forced sexual activity: Not on file  Other Topics Concern  . Not  on file  Social History Narrative  . Not on file    Review of Systems: See HPI, otherwise negative ROS  Physical Exam: BP 116/83   Pulse 78   Temp 98.8 F (37.1 C) (Temporal)   Resp 16   Ht 5\' 5"  (1.651 m)   Wt 62.6 kg   SpO2 97%   BMI 22.96 kg/m  General:   Alert,  pleasant and cooperative in NAD Head:  Normocephalic and atraumatic. Neck:  Supple; no masses or thyromegaly. Lungs:  Clear throughout to auscultation.    Heart:  Regular rate and rhythm. Abdomen:  Soft, nontender and nondistended. Normal bowel sounds, without guarding, and without rebound.   Neurologic:  Alert and  oriented x4;  grossly normal neurologically.  Impression/Plan: [name] is here for an colonoscopy to be performed for family historyu of colon cancer.  Risks, benefits, limitations, and alternatives regarding  colonoscopy have been reviewed with the patient.  Questions have  been answered.  All parties agreeable.   Dawn Lame, MD  12/24/2017, 7:29 AM

## 2017-12-24 NOTE — Anesthesia Postprocedure Evaluation (Signed)
Anesthesia Post Note  Patient: [name]  Procedure(s) Performed: COLONOSCOPY WITH BIOPSIES (N/A Rectum) POLYPECTOMY (N/A Rectum)  Patient location during evaluation: PACU Anesthesia Type: General Level of consciousness: awake Pain management: pain level controlled Vital Signs Assessment: post-procedure vital signs reviewed and stable Respiratory status: respiratory function stable Cardiovascular status: stable Postop Assessment: no signs of nausea or vomiting Anesthetic complications: no    Veda Canning

## 2017-12-27 ENCOUNTER — Encounter: Payer: Self-pay | Admitting: Gastroenterology

## 2017-12-30 ENCOUNTER — Encounter: Payer: Self-pay | Admitting: Gastroenterology

## 2018-11-10 ENCOUNTER — Ambulatory Visit: Admitting: Obstetrics and Gynecology

## 2018-11-15 ENCOUNTER — Encounter: Payer: Self-pay | Admitting: Obstetrics and Gynecology

## 2018-11-15 ENCOUNTER — Other Ambulatory Visit: Payer: Self-pay

## 2018-11-15 ENCOUNTER — Ambulatory Visit (INDEPENDENT_AMBULATORY_CARE_PROVIDER_SITE_OTHER): Payer: 59 | Admitting: Obstetrics and Gynecology

## 2018-11-15 VITALS — BP 124/78 | Ht 64.0 in | Wt 154.0 lb

## 2018-11-15 DIAGNOSIS — Z1339 Encounter for screening examination for other mental health and behavioral disorders: Secondary | ICD-10-CM

## 2018-11-15 DIAGNOSIS — Z1331 Encounter for screening for depression: Secondary | ICD-10-CM

## 2018-11-15 DIAGNOSIS — F411 Generalized anxiety disorder: Secondary | ICD-10-CM

## 2018-11-15 DIAGNOSIS — Z01419 Encounter for gynecological examination (general) (routine) without abnormal findings: Secondary | ICD-10-CM | POA: Diagnosis not present

## 2018-11-15 MED ORDER — BUSPIRONE HCL 5 MG PO TABS: ORAL_TABLET | ORAL | 1 refills | Status: DC

## 2018-11-15 NOTE — Progress Notes (Signed)
Gynecology Annual Exam  PCP: Will Bonnet, MD   Chief Complaint  Patient presents with  . Annual Exam    History of Present Illness:  [name] is a 48 y.o. G3P1011 who LMP was No LMP recorded. Patient has had a hysterectomy., presents today for her annual examination.  Her menses are absent.  She does have vasomotor symptoms.  She has hot flashes.   She still has anxiety. She stopped taking Buspar. She feels more anxious now that she is off the medication. While she was taking the medication she feels like she wasn't getting enough of a benefit.  She was taking 10 mg twice daily.  She would like re-start the medication.   She is sexually active. She does not have vaginal dryness.  Last Pap: 2014  Results were: no abnormalities /neg HPV DNA.  Hx of STDs: none  Last mammogram: 2017  Results were: normal--routine follow-up in 12 months There is no FH of breast cancer. There is no FH of ovarian cancer. The patient does not do self-breast exams.  Colonoscopy: 2019.  A couple of small polyps, diverticula. Follow up in 5 years. DEXA: has not been screened for osteoporosis  Tobacco use: currently smokes. Alcohol use: social drinker Exercise: not active  The patient wears seatbelts: yes.     Past Medical History:  Diagnosis Date  . Abnormal Pap smear of cervix   . Endometriosis   . PONV (postoperative nausea and vomiting)     Past Surgical History:  Procedure Laterality Date  . AUGMENTATION MAMMAPLASTY Bilateral 2009   SALINE  . COLONOSCOPY WITH PROPOFOL N/A 12/24/2017   Procedure: COLONOSCOPY WITH BIOPSIES;  Surgeon: Lucilla Lame, MD;  Location: [facility];  Service: Endoscopy;  Laterality: N/A;  . FRACTURE SURGERY    . HEMICOLECTOMY    . POLYPECTOMY N/A 12/24/2017   Procedure: POLYPECTOMY;  Surgeon: Lucilla Lame, MD;  Location: [facility];  Service: Endoscopy;  Laterality: N/A;  . ROBOTIC ASSISTED LAPAROSCOPIC HYSTERECTOMY AND SALPINGECTOMY       Prior to Admission medications   Medication Sig Start Date End Date Taking? Authorizing Provider  B Complex Vitamins (VITAMIN B-COMPLEX PO) Take by mouth daily.   Yes [provider]  busPIRone (BUSPAR) 10 MG tablet Take 1 tablet (10 mg total) by mouth 2 (two) times daily. 12/15/17  Yes Will Bonnet, MD  Cholecalciferol (VITAMIN D PO) Take by mouth daily.   Yes [provider]  Multiple Vitamin (MULTIVITAMIN) tablet Take 1 tablet by mouth daily.   Yes [provider]    No Known Allergies  Obstetric History: G3P1011  Family History  Problem Relation Age of Onset  . Colon cancer Mother   . Diabetes Mellitus II Father   . Heart disease Father   . Hypertension Father   . Hypertension Sister   . Colon cancer Maternal Grandmother   . Breast cancer Neg Hx     Social History   Socioeconomic History  . Marital status: Married    Spouse name: Not on file  . Number of children: Not on file  . Years of education: Not on file  . Highest education level: Not on file  Occupational History  . Not on file  Social Needs  . Financial resource strain: Not on file  . Food insecurity    Worry: Not on file    Inability: Not on file  . Transportation needs    Medical: Not on file  Non-medical: Not on file  Tobacco Use  . Smoking status: Current Every Day Smoker    Packs/day: 0.75    Years: 26.00    Pack years: 19.50  . Smokeless tobacco: Never Used  Substance and Sexual Activity  . Alcohol use: Yes    Alcohol/week: 5.0 standard drinks    Types: 5 Cans of beer per week    Comment: weekends  . Drug use: No  . Sexual activity: Yes    Birth control/protection: Surgical  Lifestyle  . Physical activity    Days per week: Not on file    Minutes per session: Not on file  . Stress: Not on file  Relationships  . Social Herbalist on phone: Not on file    Gets together: Not on file    Attends religious service: Not on file    Active  member of club or organization: Not on file    Attends meetings of clubs or organizations: Not on file    Relationship status: Not on file  . Intimate partner violence    Fear of current or ex partner: Not on file    Emotionally abused: Not on file    Physically abused: Not on file    Forced sexual activity: Not on file  Other Topics Concern  . Not on file  Social History Narrative  . Not on file    Review of Systems  Constitutional: Negative.   HENT: Negative.   Eyes: Negative.   Respiratory: Negative.   Cardiovascular: Negative.   Gastrointestinal: Negative.   Genitourinary: Negative.   Musculoskeletal: Negative.   Skin: Negative.   Neurological: Negative.   Psychiatric/Behavioral: Negative.      Physical Exam BP 124/78   Ht 5\' 4"  (1.626 m)   Wt 154 lb (69.9 kg)   BMI 26.43 kg/m   Physical Exam Constitutional:      General: She is not in acute distress.    Appearance: Normal appearance. She is well-developed.  Genitourinary:     Pelvic exam was performed with patient in the lithotomy position.     Vulva, urethra and bladder normal.     No inguinal adenopathy present in the right or left side.    No signs of injury in the vagina.     No vaginal discharge, erythema, tenderness or bleeding.     Cervix is absent.     Uterus is absent.     No right or left adnexal mass present.     Right adnexa not tender or full.     Left adnexa not tender or full.  HENT:     Head: Normocephalic and atraumatic.  Eyes:     General: No scleral icterus.    Conjunctiva/sclera: Conjunctivae normal.  Neck:     Musculoskeletal: Normal range of motion and neck supple.     Thyroid: No thyromegaly.  Cardiovascular:     Rate and Rhythm: Normal rate and regular rhythm.     Heart sounds: No murmur. No friction rub. No gallop.   Pulmonary:     Effort: Pulmonary effort is normal. No respiratory distress.     Breath sounds: Normal breath sounds. No wheezing or rales.  Chest:     Breasts:         Right: No inverted nipple, mass, nipple discharge, skin change or tenderness.        Left: No inverted nipple, mass, nipple discharge, skin change or tenderness.  Abdominal:  General: Bowel sounds are normal. There is no distension.     Palpations: Abdomen is soft. There is no mass.     Tenderness: There is no abdominal tenderness. There is no guarding or rebound.  Musculoskeletal: Normal range of motion.        General: No swelling or tenderness.  Lymphadenopathy:     Cervical: No cervical adenopathy.     Lower Body: No right inguinal adenopathy. No left inguinal adenopathy.  Neurological:     General: No focal deficit present.     Mental Status: She is alert and oriented to person, place, and time.     Cranial Nerves: No cranial nerve deficit.  Skin:    General: Skin is warm and dry.     Findings: No erythema or rash.  Psychiatric:        Mood and Affect: Mood normal.        Behavior: Behavior normal.        Judgment: Judgment normal.     Female chaperone present for pelvic and breast  portions of the physical exam  Results: AUDIT Questionnaire (screen for alcoholism): 4 PHQ-9: 4  Assessment: 48 y.o. G33P1011 female here for routine gynecologic examination.  Plan: Problem List Items Addressed This Visit      Other   Women's annual routine gynecological examination - Primary   Relevant Medications   busPIRone (BUSPAR) 5 MG tablet    Other Visit Diagnoses    Screening for depression       Screening for alcoholism       Generalized anxiety disorder       Relevant Medications   busPIRone (BUSPAR) 5 MG tablet     Screening: -- Blood pressure screen normal -- Colonoscopy - not due -- Mammogram - due. Patient to call [facility] to arrange. She understands that it is her responsibility to arrange this. -- Weight screening: normal -- Depression screening negative (PHQ-9) -- Nutrition: normal -- cholesterol screening: not due for screening -- osteoporosis  screening: not due -- tobacco screening: using: discussed quitting using the 5 A's -- alcohol screening: AUDIT questionnaire indicates low-risk usage. -- family history of breast cancer screening: done. not at high risk. -- no evidence of domestic violence or intimate partner violence. -- STD screening: gonorrhea/chlamydia NAAT not collected per patient request. -- pap smear not collected per ASCCP guidelines -- flu vaccine received today -- HPV vaccination series: not eligilbe   Anxiety: restart buspirone. May need to increase once she is at prior dose.   Prentice Docker, MD 11/15/2018 1:35 PM

## 2019-02-21 ENCOUNTER — Other Ambulatory Visit: Payer: Self-pay | Admitting: Obstetrics and Gynecology

## 2019-02-21 DIAGNOSIS — F411 Generalized anxiety disorder: Secondary | ICD-10-CM

## 2019-02-21 DIAGNOSIS — Z01419 Encounter for gynecological examination (general) (routine) without abnormal findings: Secondary | ICD-10-CM

## 2019-02-21 MED ORDER — BUSPIRONE HCL 5 MG PO TABS: ORAL_TABLET | ORAL | 0 refills | Status: DC

## 2019-06-16 ENCOUNTER — Other Ambulatory Visit: Payer: Self-pay | Admitting: Obstetrics and Gynecology

## 2019-06-16 DIAGNOSIS — Z1231 Encounter for screening mammogram for malignant neoplasm of breast: Secondary | ICD-10-CM

## 2019-07-17 ENCOUNTER — Ambulatory Visit
Admission: RE | Admit: 2019-07-17 | Discharge: 2019-07-17 | Disposition: A | Discharge status: Home / Self Care | Payer: 59 | Source: Ambulatory Visit | Attending: Obstetrics and Gynecology | Admitting: Obstetrics and Gynecology

## 2019-07-17 ENCOUNTER — Other Ambulatory Visit: Payer: Self-pay

## 2019-07-17 DIAGNOSIS — Z1231 Encounter for screening mammogram for malignant neoplasm of breast: Secondary | ICD-10-CM

## 2019-11-22 ENCOUNTER — Other Ambulatory Visit: Payer: Self-pay

## 2019-11-22 ENCOUNTER — Encounter: Payer: Self-pay | Admitting: Obstetrics and Gynecology

## 2019-11-22 ENCOUNTER — Ambulatory Visit (INDEPENDENT_AMBULATORY_CARE_PROVIDER_SITE_OTHER): Payer: 59 | Admitting: Obstetrics and Gynecology

## 2019-11-22 VITALS — BP 122/74 | Ht 65.0 in | Wt 160.0 lb

## 2019-11-22 DIAGNOSIS — Z1339 Encounter for screening examination for other mental health and behavioral disorders: Secondary | ICD-10-CM | POA: Diagnosis not present

## 2019-11-22 DIAGNOSIS — Z01419 Encounter for gynecological examination (general) (routine) without abnormal findings: Secondary | ICD-10-CM | POA: Diagnosis not present

## 2019-11-22 DIAGNOSIS — Z1329 Encounter for screening for other suspected endocrine disorder: Secondary | ICD-10-CM

## 2019-11-22 DIAGNOSIS — Z131 Encounter for screening for diabetes mellitus: Secondary | ICD-10-CM | POA: Diagnosis not present

## 2019-11-22 DIAGNOSIS — Z1331 Encounter for screening for depression: Secondary | ICD-10-CM

## 2019-11-22 DIAGNOSIS — Z0000 Encounter for general adult medical examination without abnormal findings: Secondary | ICD-10-CM

## 2019-11-22 DIAGNOSIS — Z13220 Encounter for screening for lipoid disorders: Secondary | ICD-10-CM

## 2019-11-22 NOTE — Progress Notes (Addendum)
Gynecology Annual Exam  PCP: Will Bonnet, MD  Chief Complaint  Patient presents with  . Annual Exam    History of Present Illness:  [name] is a 49 y.o. G3P1011 who LMP was No LMP recorded. Patient has had a hysterectomy., presents today for her annual examination.  Her menses are absent.  She does have vasomotor sx. She states that she has some hot flashes here and there.  She is sometimes sexually active. She does not have vaginal dryness.  Last Pap: 2014   Results were: no abnormalities /neg HPV DNA.  Hx of STDs: none  Last mammogram: 07/2019  Results were: normal--routine follow-up in 12 months There is no FH of breast cancer. There is no FH of ovarian cancer. The patient does do self-breast exams.  Colonoscopy: 2019, follow up 5 years DEXA: has not been screened for osteoporosis  Tobacco use: currently smokes. Does not desire to quit. Alcohol use: social drinker Exercise: no  The patient wears seatbelts: yes.     She no longer takes Buspar for anxiety. She has made some lifestyle changes to help with her anxiety.  Past Medical History:  Diagnosis Date  . Abnormal Pap smear of cervix   . Cancer (HCC)    cervical  . Endometriosis   . PONV (postoperative nausea and vomiting)     Past Surgical History:  Procedure Laterality Date  . AUGMENTATION MAMMAPLASTY Bilateral 2009   SALINE  . COLONOSCOPY WITH PROPOFOL N/A 12/24/2017   Procedure: COLONOSCOPY WITH BIOPSIES;  Surgeon: Lucilla Lame, MD;  Location: [facility];  Service: Endoscopy;  Laterality: N/A;  . FRACTURE SURGERY    . HEMICOLECTOMY    . POLYPECTOMY N/A 12/24/2017   Procedure: POLYPECTOMY;  Surgeon: Lucilla Lame, MD;  Location: [facility];  Service: Endoscopy;  Laterality: N/A;  . ROBOTIC ASSISTED LAPAROSCOPIC HYSTERECTOMY AND SALPINGECTOMY      Prior to Admission medications   Medication Sig Start Date End Date Taking? Authorizing Provider  B Complex Vitamins (VITAMIN  B-COMPLEX PO) Take by mouth daily.   Yes [provider]  Cholecalciferol (VITAMIN D PO) Take by mouth daily.   Yes [provider]  Multiple Vitamin (MULTIVITAMIN) tablet Take 1 tablet by mouth daily.   Yes [provider]   Allergies: No Known Allergies  Obstetric History: G3P1011  Family History  Problem Relation Age of Onset  . Colon cancer Mother   . Diabetes Mellitus II Father   . Heart disease Father   . Hypertension Father   . Hypertension Sister   . Colon cancer Maternal Grandmother   . Breast cancer Neg Hx     Social History   Socioeconomic History  . Marital status: Married    Spouse name: Not on file  . Number of children: Not on file  . Years of education: Not on file  . Highest education level: Not on file  Occupational History  . Not on file  Tobacco Use  . Smoking status: Current Every Day Smoker    Packs/day: 0.75    Years: 26.00    Pack years: 19.50  . Smokeless tobacco: Never Used  Vaping Use  . Vaping Use: Never used  Substance and Sexual Activity  . Alcohol use: Yes    Alcohol/week: 5.0 standard drinks    Types: 5 Cans of beer per week    Comment: weekends  . Drug use: No  . Sexual activity: Yes    Birth control/protection: Surgical  Other  Topics Concern  . Not on file  Social History Narrative  . Not on file   Social Determinants of Health   Financial Resource Strain:   . Difficulty of Paying Living Expenses: Not on file  Food Insecurity:   . Worried About Charity fundraiser in the Last Year: Not on file  . Ran Out of Food in the Last Year: Not on file  Transportation Needs:   . Lack of Transportation (Medical): Not on file  . Lack of Transportation (Non-Medical): Not on file  Physical Activity:   . Days of Exercise per Week: Not on file  . Minutes of Exercise per Session: Not on file  Stress:   . Feeling of Stress : Not on file  Social Connections:   . Frequency of Communication with Friends and  Family: Not on file  . Frequency of Social Gatherings with Friends and Family: Not on file  . Attends Religious Services: Not on file  . Active Member of Clubs or Organizations: Not on file  . Attends Archivist Meetings: Not on file  . Marital Status: Not on file  Intimate Partner Violence:   . Fear of Current or Ex-Partner: Not on file  . Emotionally Abused: Not on file  . Physically Abused: Not on file  . Sexually Abused: Not on file    Review of Systems  Constitutional: Negative.        Hot flashes  HENT: Negative.   Eyes: Negative.   Respiratory: Negative.   Cardiovascular: Negative.   Gastrointestinal: Negative.   Genitourinary: Negative.   Musculoskeletal: Negative.   Skin: Negative.   Neurological: Negative.   Psychiatric/Behavioral: Negative for depression, hallucinations, memory loss, substance abuse and suicidal ideas. The patient is nervous/anxious. The patient does not have insomnia.      Physical Exam BP 122/74   Ht 5\' 5"  (1.651 m)   Wt 160 lb (72.6 kg)   BMI 26.63 kg/m   Physical Exam Constitutional:      General: She is not in acute distress.    Appearance: Normal appearance. She is well-developed.  Genitourinary:     Pelvic exam was performed with patient in the lithotomy position.     Vulva, urethra and bladder normal.     No inguinal adenopathy present in the right or left side.    No signs of injury in the vagina.     No vaginal discharge, erythema, tenderness or bleeding.     Cervix is absent.     Uterus is absent.     No right or left adnexal mass present.     Right adnexa not tender or full.     Left adnexa not tender or full.  HENT:     Head: Normocephalic and atraumatic.  Eyes:     General: No scleral icterus.    Conjunctiva/sclera: Conjunctivae normal.  Neck:     Thyroid: No thyromegaly.  Cardiovascular:     Rate and Rhythm: Normal rate and regular rhythm.     Heart sounds: No murmur heard.  No friction rub. No gallop.    Pulmonary:     Effort: Pulmonary effort is normal. No respiratory distress.     Breath sounds: Normal breath sounds. No wheezing or rales.  Chest:     Breasts:        Right: No inverted nipple, mass, nipple discharge, skin change or tenderness.        Left: No inverted nipple, mass, nipple discharge, skin change  or tenderness.  Abdominal:     General: Bowel sounds are normal. There is no distension.     Palpations: Abdomen is soft. There is no mass.     Tenderness: There is no abdominal tenderness. There is no guarding or rebound.  Musculoskeletal:        General: No swelling or tenderness. Normal range of motion.     Cervical back: Normal range of motion and neck supple.  Lymphadenopathy:     Cervical: No cervical adenopathy.     Lower Body: No right inguinal adenopathy. No left inguinal adenopathy.  Neurological:     General: No focal deficit present.     Mental Status: She is alert and oriented to person, place, and time.     Cranial Nerves: No cranial nerve deficit.  Skin:    General: Skin is warm and dry.     Findings: No erythema or rash.  Psychiatric:        Mood and Affect: Mood normal.        Behavior: Behavior normal.        Judgment: Judgment normal.     Female chaperone present for pelvic and breast  portions of the physical exam  Results:  Audit: 4  Depression screen PHQ 2/9 11/22/2019  Decreased Interest 0  Down, Depressed, Hopeless 0  PHQ - 2 Score 0  Altered sleeping 0  Tired, decreased energy 1  Change in appetite 1  Feeling bad or failure about yourself  0  Trouble concentrating 0  Moving slowly or fidgety/restless 0  Suicidal thoughts 0  PHQ-9 Score 2  Difficult doing work/chores Not difficult at all     Assessment: 49 y.o. G71P1011 female here for routine gynecologic examination.  Plan: Problem List Items Addressed This Visit      Other   Women's annual routine gynecological examination - Primary   Relevant Orders   TSH   Follicle  stimulating hormone   Hgb A1c w/o eAG   Lipid Panel With LDL/HDL Ratio   CBC with Differential/Platelet   Comprehensive metabolic panel    Other Visit Diagnoses    Screening for depression       Screening for alcoholism       Screening for diabetes mellitus       Relevant Orders   Hgb A1c w/o eAG   Screening cholesterol level       Relevant Orders   Lipid Panel With LDL/HDL Ratio   Screening for thyroid disorder       Relevant Orders   TSH   Laboratory examination ordered as part of a routine general medical examination       Relevant Orders   TSH   Follicle stimulating hormone   Hgb A1c w/o eAG   Lipid Panel With LDL/HDL Ratio   CBC with Differential/Platelet   Comprehensive metabolic panel      Screening: -- Blood pressure screen normal -- Colonoscopy - not due -- Mammogram - not due -- Weight screening: normal -- Depression screening negative (PHQ-9) -- Nutrition: normal -- cholesterol screening: will obtain -- osteoporosis screening: not due -- tobacco screening: using: discussed quitting using the 5 A's -- alcohol screening: AUDIT questionnaire indicates low-risk usage. -- family history of breast cancer screening: done. not at high risk. -- no evidence of domestic violence or intimate partner violence. -- STD screening: gonorrhea/chlamydia NAAT not collected per patient request. -- pap smear not collected per ASCCP guidelines -- flu vaccine to receive today -- HPV vaccination  series: not Lanetta Inch, MD 11/22/2019 10:38 AM

## 2019-11-23 LAB — COMPREHENSIVE METABOLIC PANEL
ALT: 20 IU/L (ref 0–32)
AST: 16 IU/L (ref 0–40)
Albumin/Globulin Ratio: 2.4 — ABNORMAL HIGH (ref 1.2–2.2)
Albumin: 4.7 g/dL (ref 3.8–4.8)
Alkaline Phosphatase: 69 IU/L (ref 44–121)
BUN/Creatinine Ratio: 14 (ref 9–23)
BUN: 10 mg/dL (ref 6–24)
Bilirubin Total: 0.4 mg/dL (ref 0.0–1.2)
CO2: 22 mmol/L (ref 20–29)
Calcium: 9.5 mg/dL (ref 8.7–10.2)
Chloride: 103 mmol/L (ref 96–106)
Creatinine, Ser: 0.72 mg/dL (ref 0.57–1.00)
GFR calc Af Amer: 114 mL/min/{1.73_m2} (ref >59)
GFR calc non Af Amer: 99 mL/min/{1.73_m2} (ref >59)
Globulin, Total: 2 g/dL (ref 1.5–4.5)
Glucose: 86 mg/dL (ref 65–99)
Potassium: 4.4 mmol/L (ref 3.5–5.2)
Sodium: 140 mmol/L (ref 134–144)
Total Protein: 6.7 g/dL (ref 6.0–8.5)

## 2019-11-23 LAB — FOLLICLE STIMULATING HORMONE: FSH: 80.8 m[IU]/mL

## 2019-11-23 LAB — HGB A1C W/O EAG: Hgb A1c MFr Bld: 5.5 % (ref 4.8–5.6)

## 2019-11-23 LAB — LIPID PANEL WITH LDL/HDL RATIO
Cholesterol, Total: 245 mg/dL — ABNORMAL HIGH (ref 100–199)
HDL: 61 mg/dL (ref >39)
LDL Chol Calc (NIH): 163 mg/dL — ABNORMAL HIGH (ref 0–99)
LDL/HDL Ratio: 2.7 ratio (ref 0.0–3.2)
Triglycerides: 120 mg/dL (ref 0–149)
VLDL Cholesterol Cal: 21 mg/dL (ref 5–40)

## 2019-11-23 LAB — CBC WITH DIFFERENTIAL/PLATELET
Basophils Absolute: 0.1 10*3/uL (ref 0.0–0.2)
Basos: 2 %
EOS (ABSOLUTE): 0.3 10*3/uL (ref 0.0–0.4)
Eos: 3 %
Hematocrit: 43.9 % (ref 34.0–46.6)
Hemoglobin: 14.9 g/dL (ref 11.1–15.9)
Immature Grans (Abs): 0 10*3/uL (ref 0.0–0.1)
Immature Granulocytes: 0 %
Lymphocytes Absolute: 3.2 10*3/uL — ABNORMAL HIGH (ref 0.7–3.1)
Lymphs: 39 %
MCH: 32.7 pg (ref 26.6–33.0)
MCHC: 33.9 g/dL (ref 31.5–35.7)
MCV: 96 fL (ref 79–97)
Monocytes Absolute: 0.4 10*3/uL (ref 0.1–0.9)
Monocytes: 5 %
Neutrophils Absolute: 4.2 10*3/uL (ref 1.4–7.0)
Neutrophils: 51 %
Platelets: 340 10*3/uL (ref 150–450)
RBC: 4.56 x10E6/uL (ref 3.77–5.28)
RDW: 11.9 % (ref 11.7–15.4)
WBC: 8.3 10*3/uL (ref 3.4–10.8)

## 2019-11-23 LAB — TSH: TSH: 0.844 u[IU]/mL (ref 0.450–4.500)

## 2019-12-11 ENCOUNTER — Telehealth: Payer: Self-pay | Admitting: Obstetrics and Gynecology

## 2019-12-11 NOTE — Telephone Encounter (Signed)
I left a generic VM to call me back.

## 2019-12-12 ENCOUNTER — Encounter: Payer: Self-pay | Admitting: Obstetrics and Gynecology

## 2020-09-30 ENCOUNTER — Other Ambulatory Visit: Payer: Self-pay | Admitting: Obstetrics and Gynecology

## 2020-09-30 DIAGNOSIS — Z1231 Encounter for screening mammogram for malignant neoplasm of breast: Secondary | ICD-10-CM

## 2020-11-28 ENCOUNTER — Ambulatory Visit: Payer: 59 | Admitting: Obstetrics and Gynecology

## 2020-11-28 ENCOUNTER — Ambulatory Visit
Admission: RE | Admit: 2020-11-28 | Discharge: 2020-11-28 | Disposition: A | Discharge status: Home / Self Care | Payer: BC Managed Care – PPO | Source: Ambulatory Visit | Attending: Obstetrics and Gynecology | Admitting: Obstetrics and Gynecology

## 2020-11-28 ENCOUNTER — Other Ambulatory Visit: Payer: Self-pay

## 2020-11-28 DIAGNOSIS — Z1231 Encounter for screening mammogram for malignant neoplasm of breast: Secondary | ICD-10-CM | POA: Insufficient documentation

## 2020-12-09 ENCOUNTER — Ambulatory Visit: Payer: Self-pay | Admitting: Obstetrics and Gynecology

## 2021-01-01 ENCOUNTER — Encounter: Payer: Self-pay | Admitting: Obstetrics and Gynecology

## 2021-01-01 ENCOUNTER — Other Ambulatory Visit: Payer: Self-pay

## 2021-01-01 ENCOUNTER — Ambulatory Visit (INDEPENDENT_AMBULATORY_CARE_PROVIDER_SITE_OTHER): Payer: BC Managed Care – PPO | Admitting: Obstetrics and Gynecology

## 2021-01-01 VITALS — BP 120/80 | Ht 65.0 in | Wt 159.0 lb

## 2021-01-01 DIAGNOSIS — Z131 Encounter for screening for diabetes mellitus: Secondary | ICD-10-CM

## 2021-01-01 DIAGNOSIS — Z1339 Encounter for screening examination for other mental health and behavioral disorders: Secondary | ICD-10-CM | POA: Diagnosis not present

## 2021-01-01 DIAGNOSIS — Z1331 Encounter for screening for depression: Secondary | ICD-10-CM

## 2021-01-01 DIAGNOSIS — Z01419 Encounter for gynecological examination (general) (routine) without abnormal findings: Secondary | ICD-10-CM | POA: Diagnosis not present

## 2021-01-01 DIAGNOSIS — Z23 Encounter for immunization: Secondary | ICD-10-CM

## 2021-01-01 DIAGNOSIS — Z1329 Encounter for screening for other suspected endocrine disorder: Secondary | ICD-10-CM

## 2021-01-01 DIAGNOSIS — Z13220 Encounter for screening for lipoid disorders: Secondary | ICD-10-CM | POA: Diagnosis not present

## 2021-01-01 DIAGNOSIS — Z0000 Encounter for general adult medical examination without abnormal findings: Secondary | ICD-10-CM | POA: Diagnosis not present

## 2021-01-01 NOTE — Addendum Note (Signed)
Addended by: Cleophas Dunker D on: 01/01/2021 09:58 AM   Modules accepted: Orders

## 2021-01-01 NOTE — Progress Notes (Signed)
Gynecology Annual Exam  PCP: Will Bonnet, MD  Chief Complaint  Patient presents with   Annual Exam   History of Present Illness:  [name] is a 50 y.o. G3P1011 who LMP was No LMP recorded. Patient has had a hysterectomy., presents today for her annual examination.  Her menses are absent.  She does have vasomotor sx. She states that she has some hot flashes here and there.  She is sometimes sexually active. She does not have vaginal dryness.  Last Pap: 2014   Results were: no abnormalities /neg HPV DNA.  Hx of STDs: none  Last mammogram: 1 month.  Results were: normal--routine follow-up in 12 months There is no FH of breast cancer. There is no FH of ovarian cancer. The patient does do self-breast exams.  Colonoscopy: 2019, follow up 5 years DEXA: has not been screened for osteoporosis  Tobacco use: currently smokes. Does not desire to quit. Alcohol use: social drinker Exercise: no  The patient wears seatbelts: yes.     Past Medical History:  Diagnosis Date   Abnormal Pap smear of cervix    Cancer ([place])    cervical   Endometriosis    Family history of colon cancer    PONV (postoperative nausea and vomiting)     Past Surgical History:  Procedure Laterality Date   AUGMENTATION MAMMAPLASTY Bilateral 2009   SALINE   COLONOSCOPY WITH PROPOFOL N/A 12/24/2017   Procedure: COLONOSCOPY WITH BIOPSIES;  Surgeon: Lucilla Lame, MD;  Location: [place];  Service: Endoscopy;  Laterality: N/A;   FRACTURE SURGERY     HEMICOLECTOMY     POLYPECTOMY N/A 12/24/2017   Procedure: POLYPECTOMY;  Surgeon: Lucilla Lame, MD;  Location: [place];  Service: Endoscopy;  Laterality: N/A;   ROBOTIC ASSISTED LAPAROSCOPIC HYSTERECTOMY AND SALPINGECTOMY      Prior to Admission medications   Medication Sig Start Date End Date Taking? Authorizing Provider  B Complex Vitamins (VITAMIN B-COMPLEX PO) Take by mouth daily.   Yes [provider]  Cholecalciferol  (VITAMIN D PO) Take by mouth daily.   Yes [provider]  Multiple Vitamin (MULTIVITAMIN) tablet Take 1 tablet by mouth daily.   Yes [provider]   Allergies: No Known Allergies  Obstetric History: [id number]  Family History  Problem Relation Age of Onset   Colon cancer Mother 19   Diabetes Mellitus II Father    Heart disease Father    Hypertension Father    Hypertension Sister    Colon cancer Maternal Grandmother 36   Breast cancer Neg Hx     Social History   Socioeconomic History   Marital status: Married    Spouse name: Not on file   Number of children: Not on file   Years of education: Not on file   Highest education level: Not on file  Occupational History   Not on file  Tobacco Use   Smoking status: Every Day    Packs/day: 0.75    Years: 26.00    Pack years: 19.50    Types: Cigarettes   Smokeless tobacco: Never  Vaping Use   Vaping Use: Never used  Substance and Sexual Activity   Alcohol use: Yes    Alcohol/week: 5.0 standard drinks    Types: 5 Cans of beer per week    Comment: weekends   Drug use: No   Sexual activity: Not Currently    Birth control/protection: Surgical  Other Topics Concern   Not on file  Social History Narrative   Not on file   Social Determinants of Health   Financial Resource Strain: Not on file  Food Insecurity: Not on file  Transportation Needs: Not on file  Physical Activity: Not on file  Stress: Not on file  Social Connections: Not on file  Intimate Partner Violence: Not on file    Review of Systems  Constitutional: Negative.        Hot flashes  HENT: Negative.    Eyes: Negative.   Respiratory: Negative.    Cardiovascular: Negative.   Gastrointestinal: Negative.   Genitourinary: Negative.   Musculoskeletal: Negative.   Skin: Negative.   Neurological: Negative.   Psychiatric/Behavioral: Negative.      Physical Exam BP 120/80   Ht 5\' 5"  (1.651 m)   Wt 159 lb (72.1 kg)   BMI 26.46 kg/m    Physical Exam Constitutional:      General: She is not in acute distress.    Appearance: Normal appearance. She is well-developed.  Genitourinary:     Vulva and bladder normal.     No vaginal discharge, erythema, tenderness or bleeding.      Right Adnexa: not tender, not full and no mass present.    Left Adnexa: not tender, not full and no mass present.    Cervix is absent.     Uterus is absent.     Pelvic exam was performed with patient in the lithotomy position.  Breasts:    Right: No inverted nipple, mass, nipple discharge, skin change or tenderness.     Left: No inverted nipple, mass, nipple discharge, skin change or tenderness.  HENT:     Head: Normocephalic and atraumatic.  Eyes:     General: No scleral icterus.    Conjunctiva/sclera: Conjunctivae normal.  Neck:     Thyroid: No thyromegaly.  Cardiovascular:     Rate and Rhythm: Normal rate and regular rhythm.     Heart sounds: No murmur heard.   No friction rub. No gallop.  Pulmonary:     Effort: Pulmonary effort is normal. No respiratory distress.     Breath sounds: Normal breath sounds. No wheezing or rales.  Abdominal:     General: Bowel sounds are normal. There is no distension.     Palpations: Abdomen is soft. There is no mass.     Tenderness: There is no abdominal tenderness. There is no guarding or rebound.  Musculoskeletal:        General: No swelling or tenderness. Normal range of motion.     Cervical back: Normal range of motion and neck supple.  Lymphadenopathy:     Cervical: No cervical adenopathy.     Lower Body: No right inguinal adenopathy. No left inguinal adenopathy.  Neurological:     General: No focal deficit present.     Mental Status: She is alert and oriented to person, place, and time.     Cranial Nerves: No cranial nerve deficit.  Skin:    General: Skin is warm and dry.     Findings: No erythema or rash.  Psychiatric:        Mood and Affect: Mood normal.        Behavior: Behavior  normal.        Judgment: Judgment normal.    Female chaperone present for pelvic and breast  portions of the physical exam  Results:  AUDIT: 4 PHQ-9: 1 GAD-7: 0  Assessment: 50 y.o. G63P1011 female here for routine gynecologic examination.  Plan: Problem  List Items Addressed This Visit       Other   Women's annual routine gynecological examination - Primary   Relevant Orders   CBC with Differential/Platelet   Comprehensive metabolic panel   Lipid Panel With LDL/HDL Ratio   Hgb A1c w/o eAG   TSH   Other Visit Diagnoses     Screening for depression       Screening for alcoholism       Screening for diabetes mellitus       Relevant Orders   Hgb A1c w/o eAG   Screening cholesterol level       Relevant Orders   Lipid Panel With LDL/HDL Ratio   Screening for thyroid disorder       Relevant Orders   TSH   Laboratory examination ordered as part of a routine general medical examination       Relevant Orders   CBC with Differential/Platelet   Comprehensive metabolic panel   Lipid Panel With LDL/HDL Ratio   Hgb A1c w/o eAG   TSH      Screening: -- Blood pressure screen normal -- Colonoscopy - not due -- Mammogram - not due -- Weight screening: normal -- Depression screening negative (PHQ-9) -- Nutrition: normal -- cholesterol screening: will obtain -- osteoporosis screening: not due -- tobacco screening: using: discussed quitting using the 5 A's -- alcohol screening: AUDIT questionnaire indicates low-risk usage. -- family history of breast cancer screening: done. not at high risk. -- no evidence of domestic violence or intimate partner violence. -- STD screening: gonorrhea/chlamydia NAAT not collected per patient request. -- pap smear not collected per ASCCP guidelines -- flu vaccine  to receive today -- HPV vaccination series: not eligilbe  Prentice Docker, MD 01/01/2021 9:28 AM

## 2021-01-02 LAB — TSH: TSH: 1.07 u[IU]/mL (ref 0.450–4.500)

## 2021-01-02 LAB — COMPREHENSIVE METABOLIC PANEL
ALT: 17 IU/L (ref 0–32)
AST: 14 IU/L (ref 0–40)
Albumin/Globulin Ratio: 2.4 — ABNORMAL HIGH (ref 1.2–2.2)
Albumin: 4.8 g/dL (ref 3.8–4.8)
Alkaline Phosphatase: 75 IU/L (ref 44–121)
BUN/Creatinine Ratio: 18 (ref 9–23)
BUN: 13 mg/dL (ref 6–24)
Bilirubin Total: 0.5 mg/dL (ref 0.0–1.2)
CO2: 23 mmol/L (ref 20–29)
Calcium: 9.8 mg/dL (ref 8.7–10.2)
Chloride: 104 mmol/L (ref 96–106)
Creatinine, Ser: 0.73 mg/dL (ref 0.57–1.00)
Globulin, Total: 2 g/dL (ref 1.5–4.5)
Glucose: 87 mg/dL (ref 70–99)
Potassium: 4.6 mmol/L (ref 3.5–5.2)
Sodium: 142 mmol/L (ref 134–144)
Total Protein: 6.8 g/dL (ref 6.0–8.5)
eGFR: 100 mL/min/{1.73_m2} (ref >59)

## 2021-01-02 LAB — HGB A1C W/O EAG: Hgb A1c MFr Bld: 5.5 % (ref 4.8–5.6)

## 2021-01-02 LAB — CBC WITH DIFFERENTIAL/PLATELET
Basophils Absolute: 0.1 10*3/uL (ref 0.0–0.2)
Basos: 1 %
EOS (ABSOLUTE): 0.3 10*3/uL (ref 0.0–0.4)
Eos: 3 %
Hematocrit: 43.2 % (ref 34.0–46.6)
Hemoglobin: 14.8 g/dL (ref 11.1–15.9)
Immature Grans (Abs): 0 10*3/uL (ref 0.0–0.1)
Immature Granulocytes: 0 %
Lymphocytes Absolute: 3 10*3/uL (ref 0.7–3.1)
Lymphs: 33 %
MCH: 32.3 pg (ref 26.6–33.0)
MCHC: 34.3 g/dL (ref 31.5–35.7)
MCV: 94 fL (ref 79–97)
Monocytes Absolute: 0.4 10*3/uL (ref 0.1–0.9)
Monocytes: 4 %
Neutrophils Absolute: 5.3 10*3/uL (ref 1.4–7.0)
Neutrophils: 59 %
Platelets: 334 10*3/uL (ref 150–450)
RBC: 4.58 x10E6/uL (ref 3.77–5.28)
RDW: 11.7 % (ref 11.7–15.4)
WBC: 9 10*3/uL (ref 3.4–10.8)

## 2021-01-02 LAB — LIPID PANEL WITH LDL/HDL RATIO
Cholesterol, Total: 218 mg/dL — ABNORMAL HIGH (ref 100–199)
HDL: 58 mg/dL (ref >39)
LDL Chol Calc (NIH): 134 mg/dL — ABNORMAL HIGH (ref 0–99)
LDL/HDL Ratio: 2.3 ratio (ref 0.0–3.2)
Triglycerides: 147 mg/dL (ref 0–149)
VLDL Cholesterol Cal: 26 mg/dL (ref 5–40)

## 2021-02-06 DIAGNOSIS — J0100 Acute maxillary sinusitis, unspecified: Secondary | ICD-10-CM | POA: Diagnosis not present

## 2021-07-25 DIAGNOSIS — Z6825 Body mass index (BMI) 25.0-25.9, adult: Secondary | ICD-10-CM | POA: Diagnosis not present

## 2021-07-25 DIAGNOSIS — L723 Sebaceous cyst: Secondary | ICD-10-CM | POA: Diagnosis not present

## 2022-01-26 DIAGNOSIS — Z01419 Encounter for gynecological examination (general) (routine) without abnormal findings: Secondary | ICD-10-CM | POA: Diagnosis not present

## 2022-01-26 DIAGNOSIS — Z1329 Encounter for screening for other suspected endocrine disorder: Secondary | ICD-10-CM | POA: Diagnosis not present

## 2022-01-26 DIAGNOSIS — Z8541 Personal history of malignant neoplasm of cervix uteri: Secondary | ICD-10-CM | POA: Diagnosis not present

## 2022-01-26 DIAGNOSIS — Z13220 Encounter for screening for lipoid disorders: Secondary | ICD-10-CM | POA: Diagnosis not present

## 2022-01-26 DIAGNOSIS — Z131 Encounter for screening for diabetes mellitus: Secondary | ICD-10-CM | POA: Diagnosis not present

## 2022-01-26 DIAGNOSIS — Z1331 Encounter for screening for depression: Secondary | ICD-10-CM | POA: Diagnosis not present

## 2022-01-30 ENCOUNTER — Other Ambulatory Visit: Payer: Self-pay

## 2022-01-30 DIAGNOSIS — Z1231 Encounter for screening mammogram for malignant neoplasm of breast: Secondary | ICD-10-CM

## 2022-02-10 ENCOUNTER — Other Ambulatory Visit: Payer: Self-pay | Admitting: Obstetrics and Gynecology

## 2022-02-10 DIAGNOSIS — Z1231 Encounter for screening mammogram for malignant neoplasm of breast: Secondary | ICD-10-CM

## 2022-02-27 ENCOUNTER — Ambulatory Visit
Admission: RE | Admit: 2022-02-27 | Discharge: 2022-02-27 | Disposition: A | Discharge status: Home / Self Care | Payer: BC Managed Care – PPO | Source: Ambulatory Visit | Attending: Obstetrics and Gynecology | Admitting: Obstetrics and Gynecology

## 2022-02-27 DIAGNOSIS — Z1231 Encounter for screening mammogram for malignant neoplasm of breast: Secondary | ICD-10-CM | POA: Insufficient documentation

## 2022-09-22 ENCOUNTER — Telehealth: Payer: Self-pay | Admitting: *Deleted

## 2022-09-22 ENCOUNTER — Other Ambulatory Visit: Payer: Self-pay | Admitting: *Deleted

## 2022-09-22 ENCOUNTER — Telehealth: Payer: Self-pay

## 2022-09-22 DIAGNOSIS — Z86010 Personal history of colonic polyps: Secondary | ICD-10-CM

## 2022-09-22 DIAGNOSIS — Z800 Family history of malignant neoplasm of digestive organs: Secondary | ICD-10-CM

## 2022-09-22 MED ORDER — NA SULFATE-K SULFATE-MG SULF 17.5-3.13-1.6 GM/177ML PO SOLN: 1.0000 | Freq: Once | ORAL | 0 refills | Status: AC

## 2022-09-22 NOTE — Telephone Encounter (Signed)
Colonoscopy schedule on 01/01/2023 with Dr Servando Snare at [facility]

## 2022-09-22 NOTE — Telephone Encounter (Signed)
Gastroenterology Pre-Procedure Review  Request Date: 01/01/2023 Requesting Physician: Dr. Servando Snare  PATIENT REVIEW QUESTIONS: The patient responded to the following health history questions as indicated:    1. Are you having any GI issues? no 2. Do you have a personal history of Polyps? yes (12/24/2017) 3. Do you have a family history of Colon Cancer or Polyps? yes (mom had cancer) 4. Diabetes Mellitus? no 5. Joint replacements in the past 12 months?no 6. Major health problems in the past 3 months?no 7. Any artificial heart valves, MVP, or defibrillator?no    MEDICATIONS & ALLERGIES:    Patient reports the following regarding taking any anticoagulation/antiplatelet therapy:   Plavix, Coumadin, Eliquis, Xarelto, Lovenox, Pradaxa, Brilinta, or Effient? no Aspirin? no  Patient confirms/reports the following medications:  Current Outpatient Medications  Medication Sig Dispense Refill   Na Sulfate-K Sulfate-Mg Sulf 17.5-3.13-1.6 GM/177ML SOLN Take 1 kit by mouth once for 1 dose. 354 mL 0   B Complex Vitamins (VITAMIN B-COMPLEX PO) Take by mouth daily. (Patient not taking: Reported on 01/01/2021)     busPIRone (BUSPAR) 5 MG tablet Increase dosage in the AM or PM by 5 mg every 4 days until you're taking 20 mg twice a day. 240 tablet 0   Cholecalciferol (VITAMIN D PO) Take by mouth daily. (Patient not taking: Reported on 01/01/2021)     Multiple Vitamin (MULTIVITAMIN) tablet Take 1 tablet by mouth daily. (Patient not taking: Reported on 01/01/2021)     No current facility-administered medications for this visit.    Patient confirms/reports the following allergies:  No Known Allergies  Orders Placed This Encounter  Procedures   Ambulatory referral to Gastroenterology    Referral Priority:   Routine    Referral Type:   Consultation    Referral Reason:   Specialty Services Required    Referred to Provider:   Midge Minium, MD    Number of Visits Requested:   1    AUTHORIZATION  INFORMATION Primary Insurance: 1D#: Group #:  Secondary Insurance: 1D#: Group #:  SCHEDULE INFORMATION: Date: 01/01/2023 Time: Location:  [facility]

## 2022-09-22 NOTE — Telephone Encounter (Signed)
Patient is calling to schedule her repeat colonoscopy with Dr. Servando Snare. Please call patient to schedule

## 2022-12-21 ENCOUNTER — Encounter: Payer: Self-pay | Admitting: Gastroenterology

## 2022-12-30 ENCOUNTER — Encounter: Payer: Self-pay | Admitting: Gastroenterology

## 2022-12-30 NOTE — Anesthesia Preprocedure Evaluation (Addendum)
Anesthesia Evaluation  Patient identified by MRN, date of birth, ID band Patient awake    Reviewed: Allergy & Precautions, H&P , NPO status , Patient's Chart, lab work & pertinent test results  History of Anesthesia Complications (+) PONV and history of anesthetic complications  Airway Mallampati: I       Dental   One crown right lower jaw:   Pulmonary Current Smoker and Patient abstained from smoking.          Cardiovascular negative cardio ROS      Neuro/Psych  PSYCHIATRIC DISORDERS Anxiety     negative neurological ROS  negative psych ROS   GI/Hepatic negative GI ROS, Neg liver ROS,,,  Endo/Other  negative endocrine ROS    Renal/GU negative Renal ROS  negative genitourinary   Musculoskeletal negative musculoskeletal ROS (+)    Abdominal   Peds negative pediatric ROS (+)  Hematology negative hematology ROS (+)   Anesthesia Other Findings PONV--became nauseated two days after hysterectomy and two days after partial colectomy, but not immediately postop Generalized anxiety disorder  Reproductive/Obstetrics negative OB ROS                              Anesthesia Physical Anesthesia Plan  ASA: 1  Anesthesia Plan: General   Post-op Pain Management:    Induction: Intravenous  PONV Risk Score and Plan:   Airway Management Planned: Natural Airway and Nasal Cannula  Additional Equipment:   Intra-op Plan:   Post-operative Plan:   Informed Consent: I have reviewed the patients History and Physical, chart, labs and discussed the procedure including the risks, benefits and alternatives for the proposed anesthesia with the patient or authorized representative who has indicated his/her understanding and acceptance.     Dental Advisory Given  Plan Discussed with: Anesthesiologist, CRNA and Surgeon  Anesthesia Plan Comments: (Patient consented for risks of anesthesia including but  not limited to:  - adverse reactions to medications - risk of airway placement if required - damage to eyes, teeth, lips or other oral mucosa - nerve damage due to positioning  - sore throat or hoarseness - Damage to heart, brain, nerves, lungs, other parts of body or loss of life  Patient voiced understanding and assent.)        Anesthesia Quick Evaluation

## 2023-01-01 ENCOUNTER — Encounter
Admission: RE | Disposition: A | Discharge status: Home / Self Care | Payer: Self-pay | Source: Home / Self Care | Attending: Gastroenterology

## 2023-01-01 ENCOUNTER — Ambulatory Visit
Admission: RE | Admit: 2023-01-01 | Discharge: 2023-01-01 | Disposition: A | Discharge status: Home / Self Care | Payer: BC Managed Care – PPO | Attending: Gastroenterology | Admitting: Gastroenterology

## 2023-01-01 ENCOUNTER — Encounter: Payer: Self-pay | Admitting: Gastroenterology

## 2023-01-01 ENCOUNTER — Other Ambulatory Visit: Payer: Self-pay

## 2023-01-01 ENCOUNTER — Ambulatory Visit: Payer: BC Managed Care – PPO | Admitting: Anesthesiology

## 2023-01-01 DIAGNOSIS — Z860100 Personal history of colon polyps, unspecified: Secondary | ICD-10-CM | POA: Diagnosis not present

## 2023-01-01 DIAGNOSIS — F17210 Nicotine dependence, cigarettes, uncomplicated: Secondary | ICD-10-CM | POA: Insufficient documentation

## 2023-01-01 DIAGNOSIS — Z860101 Personal history of adenomatous and serrated colon polyps: Secondary | ICD-10-CM | POA: Diagnosis not present

## 2023-01-01 DIAGNOSIS — F17290 Nicotine dependence, other tobacco product, uncomplicated: Secondary | ICD-10-CM | POA: Diagnosis not present

## 2023-01-01 DIAGNOSIS — Z1211 Encounter for screening for malignant neoplasm of colon: Secondary | ICD-10-CM | POA: Insufficient documentation

## 2023-01-01 DIAGNOSIS — Z800 Family history of malignant neoplasm of digestive organs: Secondary | ICD-10-CM | POA: Insufficient documentation

## 2023-01-01 DIAGNOSIS — K5730 Diverticulosis of large intestine without perforation or abscess without bleeding: Secondary | ICD-10-CM | POA: Insufficient documentation

## 2023-01-01 DIAGNOSIS — Z980 Intestinal bypass and anastomosis status: Secondary | ICD-10-CM | POA: Insufficient documentation

## 2023-01-01 HISTORY — DX: Generalized anxiety disorder: F41.1

## 2023-01-01 HISTORY — PX: COLONOSCOPY WITH PROPOFOL: SHX5780

## 2023-01-01 SURGERY — COLONOSCOPY WITH PROPOFOL
Anesthesia: General

## 2023-01-01 MED ORDER — PROPOFOL 10 MG/ML IV BOLUS
INTRAVENOUS | Status: DC | PRN
  Administered 2023-01-01: 100 mg via INTRAVENOUS
  Administered 2023-01-01 (×4): 25 mg via INTRAVENOUS
  Administered 2023-01-01: 50 mg via INTRAVENOUS

## 2023-01-01 MED ORDER — PROPOFOL 10 MG/ML IV BOLUS
INTRAVENOUS | Status: AC
  Filled 2023-01-01: qty 20

## 2023-01-01 MED ORDER — PROPOFOL 1000 MG/100ML IV EMUL
INTRAVENOUS | Status: AC
  Filled 2023-01-01: qty 100

## 2023-01-01 MED ORDER — LACTATED RINGERS IV SOLN: INTRAVENOUS | Status: DC

## 2023-01-01 MED ORDER — SODIUM CHLORIDE 0.9 % IV SOLN
INTRAVENOUS | Status: DC
Start: 2023-01-01 — End: 2023-01-01

## 2023-01-01 MED ORDER — LIDOCAINE HCL (PF) 2 % IJ SOLN
INTRAMUSCULAR | Status: AC
  Filled 2023-01-01: qty 5

## 2023-01-01 SURGICAL SUPPLY — 4 items
GOWN CVR UNV OPN BCK APRN NK (MISCELLANEOUS) ×2 IMPLANT
KIT PRC NS LF DISP ENDO (KITS) ×1 IMPLANT
MANIFOLD NEPTUNE II (INSTRUMENTS) ×1 IMPLANT
WATER STERILE IRR 250ML POUR (IV SOLUTION) ×1 IMPLANT

## 2023-01-01 NOTE — H&P (Signed)
Midge Minium, MD [facility] [address] [place], [place] [address] [phone] Fax : [phone]  Primary Care Physician:  Conard Novak, MD Primary Gastroenterologist:  Dr. Servando Snare  Pre-Procedure History & Physical: HPI:  [name] is a 52 y.o. female is here for an colonoscopy.   Past Medical History:  Diagnosis Date   Abnormal Pap smear of cervix    Endometriosis    Family history of colon cancer    Generalized anxiety disorder    PONV (postoperative nausea and vomiting)     Past Surgical History:  Procedure Laterality Date   AUGMENTATION MAMMAPLASTY Bilateral 2009   SALINE   COLONOSCOPY WITH PROPOFOL N/A 12/24/2017   Procedure: COLONOSCOPY WITH BIOPSIES;  Surgeon: Midge Minium, MD;  Location: [facility];  Service: Endoscopy;  Laterality: N/A;   FRACTURE SURGERY     HEMICOLECTOMY     POLYPECTOMY N/A 12/24/2017   Procedure: POLYPECTOMY;  Surgeon: Midge Minium, MD;  Location: [facility];  Service: Endoscopy;  Laterality: N/A;   ROBOTIC ASSISTED LAPAROSCOPIC HYSTERECTOMY AND SALPINGECTOMY      Prior to Admission medications   Medication Sig Start Date End Date Taking? Authorizing Provider  TIRZEPATIDE [place] Inject into the skin once a week. Thursday   Yes [provider]    Allergies as of 09/22/2022   (No Known Allergies)    Family History  Problem Relation Age of Onset   Colon cancer Mother 8   Diabetes Mellitus II Father    Heart disease Father    Hypertension Father    Hypertension Sister    Colon cancer Maternal Grandmother 51   Breast cancer Neg Hx     Social History   Socioeconomic History   Marital status: Married    Spouse name: Not on file   Number of children: Not on file   Years of education: Not on file   Highest education level: Not on file  Occupational History   Not on file  Tobacco Use   Smoking status: Every Day    Current packs/day: 0.15    Average packs/day: 0.7 packs/day for 32.9 years  (24.3 ttl pk-yrs)    Types: Cigarettes, E-cigarettes    Start date: 1992   Smokeless tobacco: Never  Vaping Use   Vaping status: Every Day   Substances: Nicotine, Flavoring   Devices: [employer] disposable  Substance and Sexual Activity   Alcohol use: Yes    Alcohol/week: 5.0 standard drinks of alcohol    Types: 5 Cans of beer per week    Comment: weekends   Drug use: No   Sexual activity: Not Currently    Birth control/protection: Surgical  Other Topics Concern   Not on file  Social History Narrative   Not on file   Social Determinants of Health   Financial Resource Strain: Not on file  Food Insecurity: Not on file  Transportation Needs: Not on file  Physical Activity: Not on file  Stress: Not on file  Social Connections: Not on file  Intimate Partner Violence: Not on file    Review of Systems: See HPI, otherwise negative ROS  Physical Exam: BP 104/79   Temp 98.2 F (36.8 C)   Resp 12   Ht 5\' 5"  (1.651 m)   Wt 67 kg   SpO2 97%   BMI 24.60 kg/m  General:   Alert,  pleasant and cooperative in NAD Head:  Normocephalic and atraumatic. Neck:  Supple; no masses or thyromegaly. Lungs:  Clear  throughout to auscultation.    Heart:  Regular rate and rhythm. Abdomen:  Soft, nontender and nondistended. Normal bowel sounds, without guarding, and without rebound.   Neurologic:  Alert and  oriented x4;  grossly normal neurologically.  Impression/Plan: [name] is here for an colonoscopy to be performed for a history of adenomatous polyps on a history of adenomatous polyps on 2019    Risks, benefits, limitations, and alternatives regarding  colonoscopy have been reviewed with the patient.  Questions have been answered.  All parties agreeable.   Midge Minium, MD  01/01/2023, 7:09 AM

## 2023-01-01 NOTE — Op Note (Signed)
[facility] Gastroenterology Patient Name: [name] Procedure Date: 01/01/2023 7:17 AM MRN: [id number] Account #: [id number] Date of Birth: Sep 03, 1970 Admit Type: Outpatient Age: 52 Room: [facility] OR ROOM 01 Gender: Female Note Status: Finalized Instrument Name: [id number] Procedure:             Colonoscopy Indications:           High risk colon cancer surveillance: Personal history                         of colonic polyps Providers:             Midge Minium MD, MD Referring MD:          Conard Novak (Referring MD) Medicines:             Propofol per Anesthesia Complications:         No immediate complications. Procedure:             Pre-Anesthesia Assessment:                        - Prior to the procedure, a History and Physical was                         performed, and patient medications and allergies were                         reviewed. The patient's tolerance of previous                         anesthesia was also reviewed. The risks and benefits                         of the procedure and the sedation options and risks                         were discussed with the patient. All questions were                         answered, and informed consent was obtained. Prior                         Anticoagulants: The patient has taken no anticoagulant                         or antiplatelet agents. ASA Grade Assessment: II - A                         patient with mild systemic disease. After reviewing                         the risks and benefits, the patient was deemed in                         satisfactory condition to undergo the procedure.                        After obtaining informed consent, the colonoscope was  passed under direct vision. Throughout the procedure,                         the patient's blood pressure, pulse, and oxygen                         saturations were monitored continuously. The                          Colonoscope was introduced through the anus and                         advanced to the the ileocolonic anastomosis. The                         colonoscopy was performed without difficulty. The                         patient tolerated the procedure well. The quality of                         the bowel preparation was excellent. Findings:      The perianal and digital rectal examinations were normal.      Multiple small-mouthed diverticula were found in the sigmoid colon and       descending colon.      There was evidence of a prior end-to-end colo-colonic anastomosis in the       transverse colon. This was patent. Impression:            - Diverticulosis in the sigmoid colon and in the                         descending colon.                        - Patent end-to-end colo-colonic anastomosis.                        - No specimens collected. Recommendation:        - Discharge patient to home.                        - Resume previous diet.                        - Continue present medications.                        - Repeat colonoscopy in 5 years for surveillance. Procedure Code(s):     --- Professional ---                        [id number], Colonoscopy, flexible; diagnostic, including                         collection of specimen(s) by brushing or washing, when                         performed (separate procedure) Diagnosis Code(s):     --- Professional ---  Z86.010, Personal history of colonic polyps CPT copyright 2022 American Medical Association. All rights reserved. The codes documented in this report are preliminary and upon coder review may  be revised to meet current compliance requirements. Midge Minium MD, MD 01/01/2023 7:52:32 AM This report has been signed electronically. Number of Addenda: 0 Note Initiated On: 01/01/2023 7:17 AM Scope Withdrawal Time: 0 hours 8 minutes 38 seconds  Total Procedure Duration: 0 hours 14 minutes 45 seconds   Estimated Blood Loss:  Estimated blood loss: none.      [facility]

## 2023-01-01 NOTE — Anesthesia Postprocedure Evaluation (Signed)
Anesthesia Post Note  Patient: [name]  Procedure(s) Performed: COLONOSCOPY WITH PROPOFOL  Patient location during evaluation: PACU Anesthesia Type: General Level of consciousness: awake and alert Pain management: pain level controlled Vital Signs Assessment: post-procedure vital signs reviewed and stable Respiratory status: spontaneous breathing, nonlabored ventilation, respiratory function stable and patient connected to nasal cannula oxygen Cardiovascular status: blood pressure returned to baseline and stable Postop Assessment: no apparent nausea or vomiting Anesthetic complications: no   No notable events documented.   Last Vitals:  Vitals:   01/01/23 0752 01/01/23 0803  BP: (!) 82/65 104/77  Pulse: 85 78  Resp: 14 14  Temp: 36.6 C 36.6 C  SpO2: 98% 97%    Last Pain:  Vitals:   01/01/23 0803  PainSc: 0-No pain                 Altariq Goodall C [place]

## 2023-01-01 NOTE — Transfer of Care (Signed)
Immediate Anesthesia Transfer of Care Note  Patient: [name]  Procedure(s) Performed: COLONOSCOPY WITH PROPOFOL  Patient Location: PACU  Anesthesia Type: General  Level of Consciousness: awake, alert  and patient cooperative  Airway and Oxygen Therapy: Patient Spontanous Breathing and Patient connected to supplemental oxygen  Post-op Assessment: Post-op Vital signs reviewed, Patient's Cardiovascular Status Stable, Respiratory Function Stable, Patent Airway and No signs of Nausea or vomiting  Post-op Vital Signs: Reviewed and stable  Complications: No notable events documented.

## 2023-01-02 ENCOUNTER — Encounter: Payer: Self-pay | Admitting: Gastroenterology

## 2023-01-14 ENCOUNTER — Other Ambulatory Visit: Payer: Self-pay | Admitting: Obstetrics and Gynecology

## 2023-01-14 DIAGNOSIS — Z1231 Encounter for screening mammogram for malignant neoplasm of breast: Secondary | ICD-10-CM

## 2023-02-11 IMAGING — MG DIGITAL SCREENING BREAST BILAT IMPLANT W/ TOMO W/ CAD
8 of 12 series · 8 of 28 positions shown · non-contrast
Comparison: Previous exam(s).

CLINICAL DATA: Screening.

EXAM:
DIGITAL SCREENING BILATERAL MAMMOGRAM WITH IMPLANTS, CAD AND
TOMOSYNTHESIS
TECHNIQUE: Bilateral screening digital craniocaudal and mediolateral oblique
mammograms were obtained. Bilateral screening digital breast
tomosynthesis was performed. The images were evaluated with
computer-aided detection. Standard and/or implant displaced views
were performed.

[R CC]
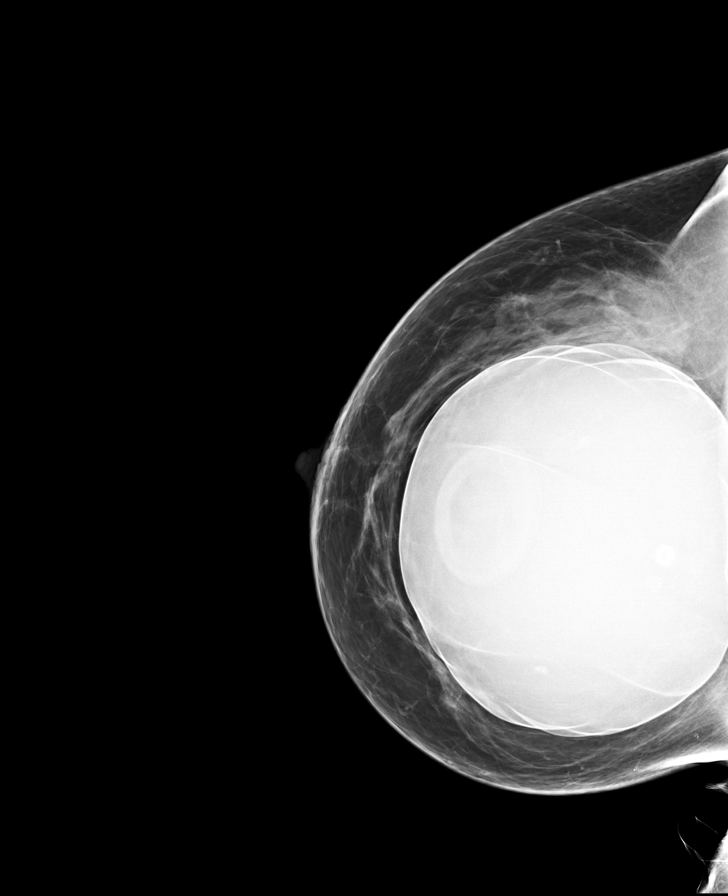

[L MLO]
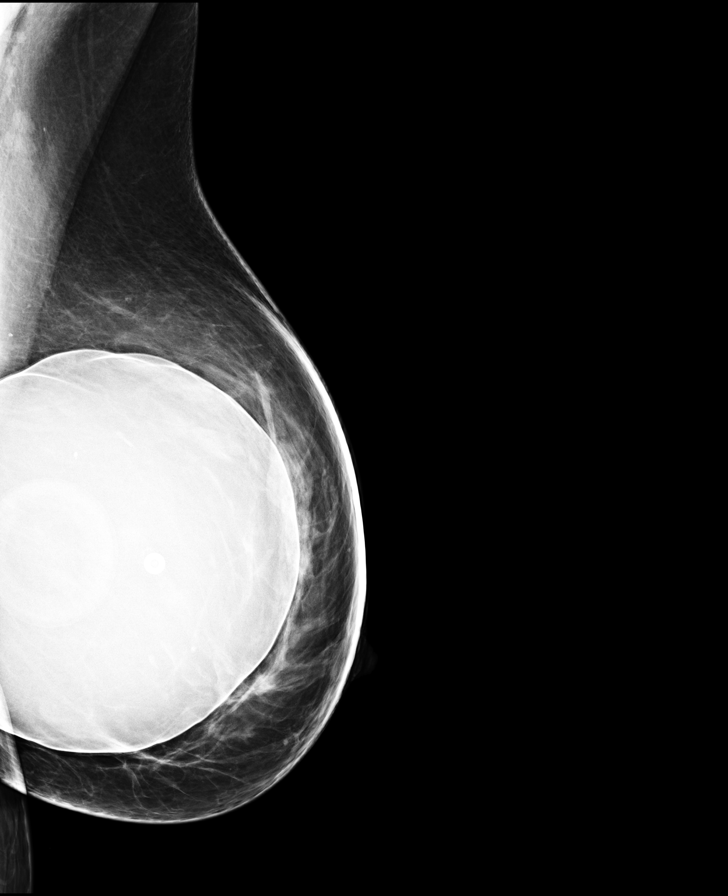

[R MLO]
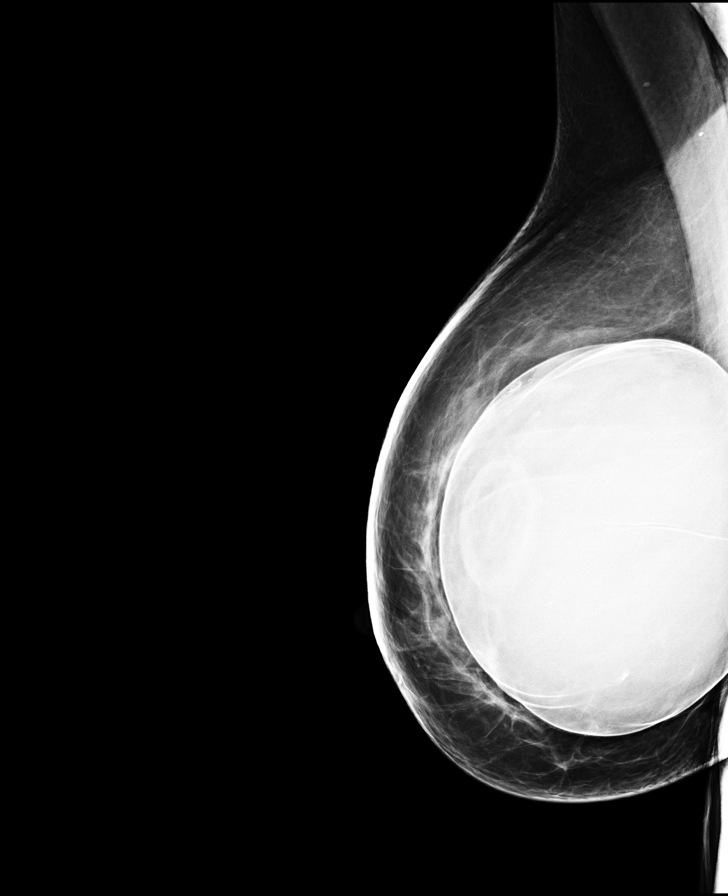

[L CC]
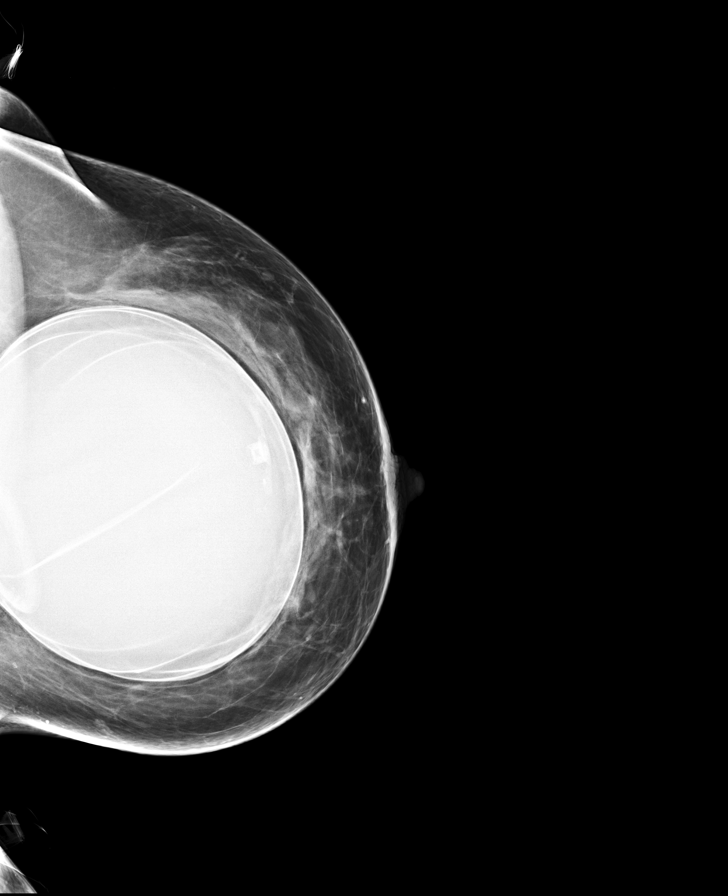

[R MLO synth-2D]
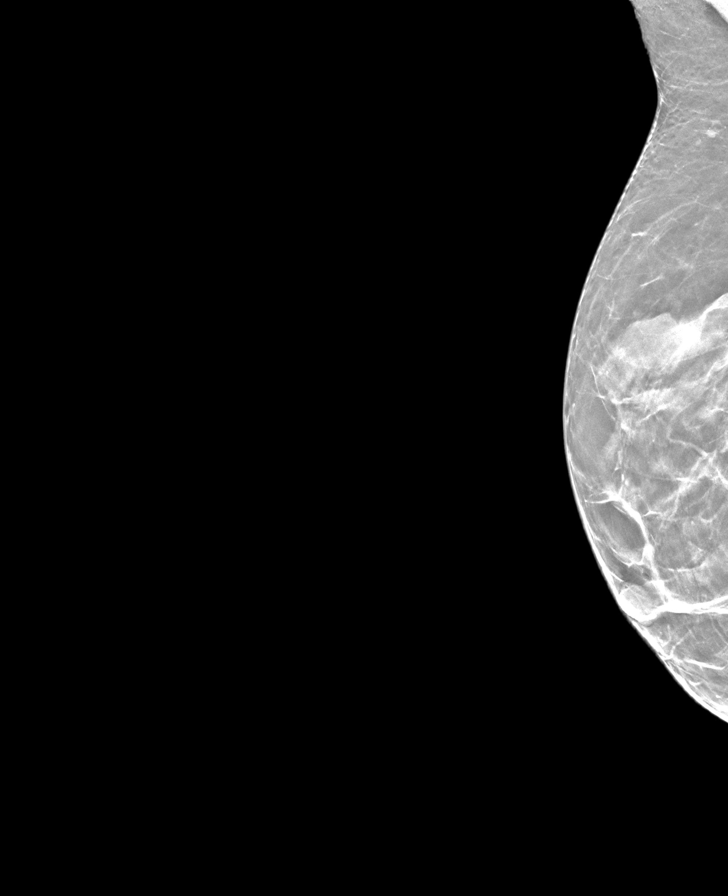

[L CC synth-2D]
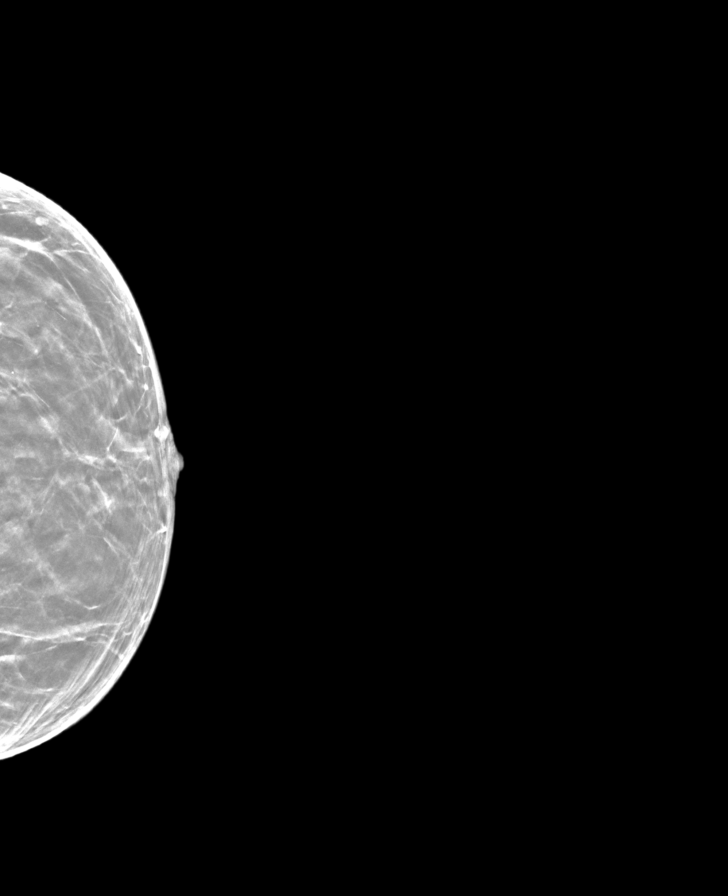

[L MLO synth-2D]
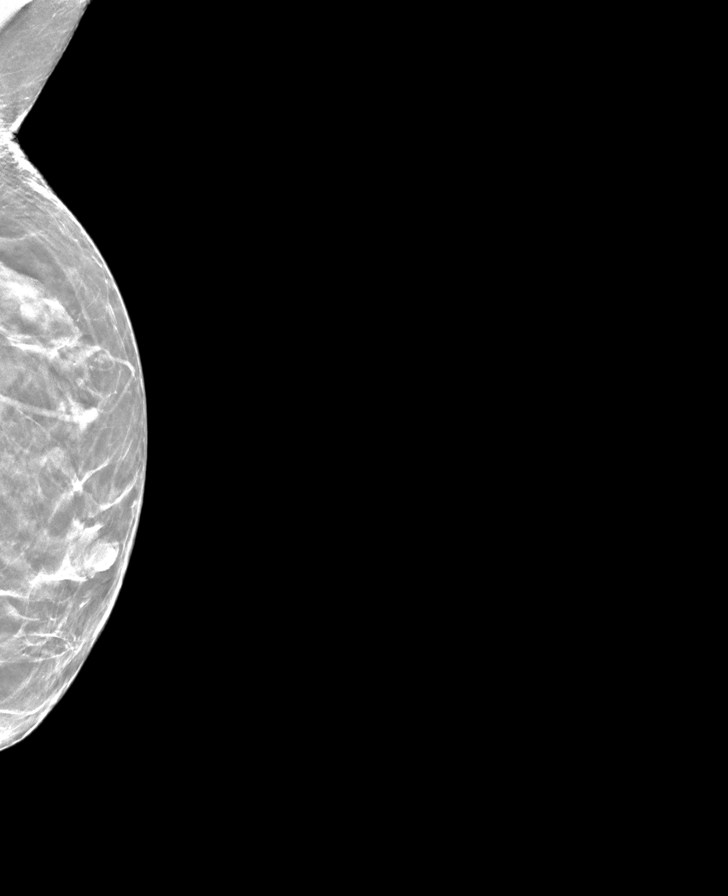

[R CC synth-2D]
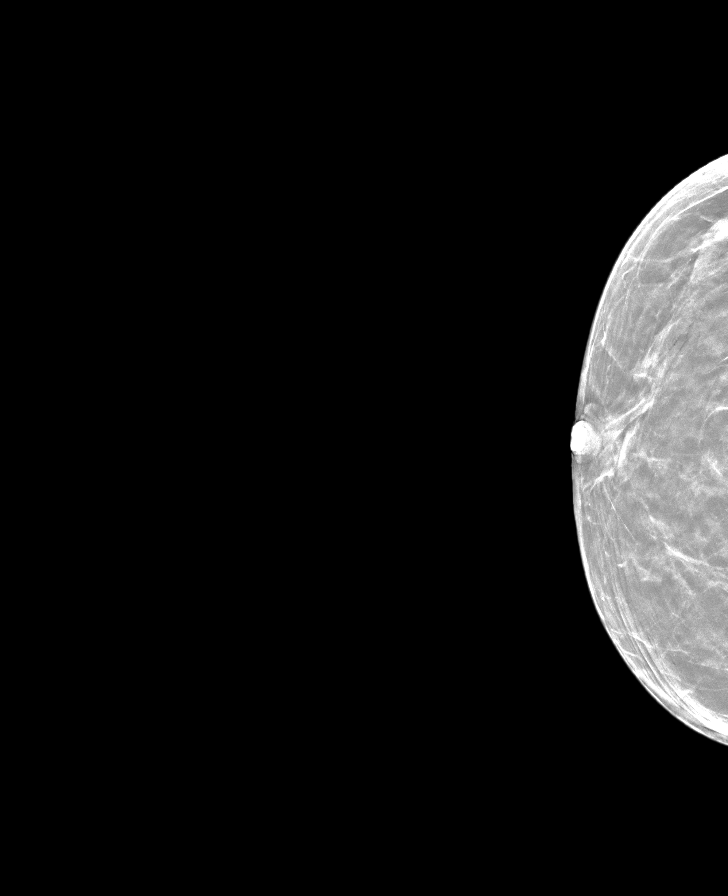

[8 of 28 positions shown; findings below may reference images not displayed]

ACR Breast Density Category b: There are scattered areas of
fibroglandular density.
FINDINGS: The patient has prepectoral implants. There are no findings
suspicious for malignancy.
IMPRESSION: No mammographic evidence of malignancy. A result letter of this
screening mammogram will be mailed directly to the patient.

RECOMMENDATION:
Screening mammogram in one year. (Code:TC-L-OXK)

BI-RADS CATEGORY  1:  Negative.

## 2023-03-08 ENCOUNTER — Ambulatory Visit
Admission: RE | Admit: 2023-03-08 | Discharge: 2023-03-08 | Disposition: A | Discharge status: Home / Self Care | Payer: BC Managed Care – PPO | Source: Ambulatory Visit | Attending: Obstetrics and Gynecology | Admitting: Obstetrics and Gynecology

## 2023-03-08 DIAGNOSIS — Z1231 Encounter for screening mammogram for malignant neoplasm of breast: Secondary | ICD-10-CM | POA: Insufficient documentation
